# Patient Record
Sex: Female | Born: 1935
Health system: Southern US, Community
[De-identification: ages and names within clinical notes are randomized; demographics above are authoritative.]

## PROBLEM LIST (undated history)

## (undated) DIAGNOSIS — E785 Hyperlipidemia, unspecified: Secondary | ICD-10-CM

## (undated) DIAGNOSIS — I10 Essential (primary) hypertension: Secondary | ICD-10-CM

## (undated) DIAGNOSIS — F419 Anxiety disorder, unspecified: Secondary | ICD-10-CM

## (undated) HISTORY — DX: Anxiety disorder, unspecified: F41.9

## (undated) HISTORY — DX: Hyperlipidemia, unspecified: E78.5

## (undated) HISTORY — PX: APPENDECTOMY: SHX54

## (undated) HISTORY — PX: ABDOMINAL HYSTERECTOMY: SHX81

## (undated) HISTORY — PX: VEIN LIGATION AND STRIPPING: SHX2653

## (undated) HISTORY — PX: BLADDER SURGERY: SHX569

## (undated) HISTORY — PX: CATARACT EXTRACTION: SUR2

---

## 2001-04-16 ENCOUNTER — Ambulatory Visit (HOSPITAL_COMMUNITY): Admission: RE | Admit: 2001-04-16 | Discharge: 2001-04-16 | Payer: Self-pay

## 2010-06-10 ENCOUNTER — Emergency Department: Payer: Self-pay | Admitting: Emergency Medicine

## 2010-06-28 ENCOUNTER — Emergency Department (HOSPITAL_COMMUNITY): Admission: EM | Admit: 2010-06-28 | Discharge: 2010-06-29 | Payer: Self-pay | Admitting: Emergency Medicine

## 2011-01-03 LAB — DIFFERENTIAL
Basophils Absolute: 0 10*3/uL (ref 0.0–0.1)
Basophils Relative: 0 % (ref 0–1)
Eosinophils Absolute: 0 10*3/uL (ref 0.0–0.7)
Eosinophils Relative: 0 % (ref 0–5)
Lymphocytes Relative: 21 % (ref 12–46)
Lymphs Abs: 2.1 10*3/uL (ref 0.7–4.0)
Monocytes Absolute: 0.5 10*3/uL (ref 0.1–1.0)
Monocytes Relative: 5 % (ref 3–12)
Neutro Abs: 7.2 10*3/uL (ref 1.7–7.7)
Neutrophils Relative %: 73 % (ref 43–77)

## 2011-01-03 LAB — CBC
HCT: 41.1 % (ref 36.0–46.0)
Hemoglobin: 13.6 g/dL (ref 12.0–15.0)
MCH: 30.2 pg (ref 26.0–34.0)
MCHC: 33.2 g/dL (ref 30.0–36.0)
MCV: 90.9 fL (ref 78.0–100.0)
Platelets: 178 10*3/uL (ref 150–400)
RBC: 4.52 MIL/uL (ref 3.87–5.11)
RDW: 14.1 % (ref 11.5–15.5)
WBC: 9.9 10*3/uL (ref 4.0–10.5)

## 2011-01-03 LAB — BASIC METABOLIC PANEL
BUN: 17 mg/dL (ref 6–23)
CO2: 33 mEq/L — ABNORMAL HIGH (ref 19–32)
Calcium: 9.2 mg/dL (ref 8.4–10.5)
Chloride: 98 mEq/L (ref 96–112)
Creatinine, Ser: 1.26 mg/dL — ABNORMAL HIGH (ref 0.4–1.2)
GFR calc Af Amer: 50 mL/min — ABNORMAL LOW (ref >60)
GFR calc non Af Amer: 42 mL/min — ABNORMAL LOW (ref >60)
Glucose, Bld: 167 mg/dL — ABNORMAL HIGH (ref 70–99)
Potassium: 2.9 mEq/L — ABNORMAL LOW (ref 3.5–5.1)
Sodium: 140 mEq/L (ref 135–145)

## 2011-07-20 ENCOUNTER — Emergency Department (HOSPITAL_COMMUNITY)
Admission: EM | Admit: 2011-07-20 | Discharge: 2011-07-20 | Disposition: A | Discharge status: Home / Self Care | Payer: Medicare Other | Attending: Emergency Medicine | Admitting: Emergency Medicine

## 2011-07-20 ENCOUNTER — Encounter: Payer: Self-pay | Admitting: *Deleted

## 2011-07-20 DIAGNOSIS — R21 Rash and other nonspecific skin eruption: Secondary | ICD-10-CM | POA: Insufficient documentation

## 2011-07-20 DIAGNOSIS — R1110 Vomiting, unspecified: Secondary | ICD-10-CM | POA: Insufficient documentation

## 2011-07-20 DIAGNOSIS — T7840XA Allergy, unspecified, initial encounter: Secondary | ICD-10-CM

## 2011-07-20 DIAGNOSIS — L500 Allergic urticaria: Secondary | ICD-10-CM | POA: Insufficient documentation

## 2011-07-20 DIAGNOSIS — E876 Hypokalemia: Secondary | ICD-10-CM | POA: Insufficient documentation

## 2011-07-20 HISTORY — DX: Essential (primary) hypertension: I10

## 2011-07-20 LAB — DIFFERENTIAL
Basophils Relative: 0 % (ref 0–1)
Eosinophils Absolute: 0 10*3/uL (ref 0.0–0.7)
Eosinophils Relative: 0 % (ref 0–5)
Lymphs Abs: 1.4 10*3/uL (ref 0.7–4.0)
Monocytes Absolute: 0.4 10*3/uL (ref 0.1–1.0)
Monocytes Relative: 4 % (ref 3–12)
Neutrophils Relative %: 79 % — ABNORMAL HIGH (ref 43–77)

## 2011-07-20 LAB — BASIC METABOLIC PANEL
BUN: 25 mg/dL — ABNORMAL HIGH (ref 6–23)
CO2: 32 mEq/L (ref 19–32)
Chloride: 99 mEq/L (ref 96–112)
Glucose, Bld: 167 mg/dL — ABNORMAL HIGH (ref 70–99)
Potassium: 3 mEq/L — ABNORMAL LOW (ref 3.5–5.1)
Sodium: 140 mEq/L (ref 135–145)

## 2011-07-20 LAB — CBC
HCT: 42.7 % (ref 36.0–46.0)
Hemoglobin: 13.9 g/dL (ref 12.0–15.0)
MCH: 30 pg (ref 26.0–34.0)
MCHC: 32.6 g/dL (ref 30.0–36.0)
MCV: 92 fL (ref 78.0–100.0)
RBC: 4.64 MIL/uL (ref 3.87–5.11)

## 2011-07-20 MED ORDER — DIPHENHYDRAMINE HCL 50 MG/ML IJ SOLN
INTRAMUSCULAR | Status: AC
  Filled 2011-07-20: qty 1

## 2011-07-20 MED ORDER — METHYLPREDNISOLONE SODIUM SUCC 125 MG IJ SOLR
125.0000 mg | Freq: Once | INTRAMUSCULAR | Status: AC
  Administered 2011-07-20: 125 mg via INTRAVENOUS

## 2011-07-20 MED ORDER — DIPHENHYDRAMINE HCL 50 MG/ML IJ SOLN
50.0000 mg | Freq: Once | INTRAMUSCULAR | Status: AC
  Administered 2011-07-20: 50 mg via INTRAVENOUS

## 2011-07-20 MED ORDER — SODIUM CHLORIDE 0.9 % IV BOLUS (SEPSIS)
1000.0000 mL | Freq: Once | INTRAVENOUS | Status: AC
  Administered 2011-07-20: 1000 mL via INTRAVENOUS

## 2011-07-20 MED ORDER — METHYLPREDNISOLONE SODIUM SUCC 125 MG IJ SOLR
INTRAMUSCULAR | Status: AC
  Filled 2011-07-20: qty 2

## 2011-07-20 NOTE — ED Notes (Signed)
Pt c/o vomiting, hives, and tongue swelling since 2:30 this afternoon. Pt states that she has not taken or used any new medicines or products.

## 2011-07-20 NOTE — ED Provider Notes (Addendum)
I saw and examined this patient with the resident and agree with the chart and findings here in.  FaAlert HEENT:  Atraumatic Lungs:  No difficulty breathing Abdomen:  nondistended   Nelia Shi, MD 07/20/11 2051  Nelia Shi, MD 08/13/11 2241

## 2011-07-20 NOTE — ED Provider Notes (Signed)
History     CSN: [id number] Arrival date & time: 07/20/2011  6:08 PM  Chief Complaint  Patient presents with  . Allergic Reaction    (Consider location/radiation/quality/duration/timing/severity/associated sxs/prior treatment) HPI Comments: Pt was in normal state of health until about 2:30/3p this afternoon when she returned home from a trip and started feeling itchy around her underwear and bra lines. She noticed redness and welts so she took a benadryl and took a hot shower.  She rubbed liquid benadryl all over her body when she noticed the itching/redness spreading.  Also feels like her tongue is swollen. No swelling in throat, no difficulty swallowing or SOB. No fever/chills. No nausea but did vomit x1 when she got here. No known allergies.  [place], when they arrived there yest, noticed that a skunk had died so they sprayed the house w/ lysol and other products and put out vinegar water to help with the smell.  They went out to dinner, came back and slept there last night. She has had an allergic rxn like this 2x before, both times ~6 months ago (those 2 episodes were w/in a few weeks of eachother). Had allergy testing done at that time which was unremarkable and did not show any new allergies.  Is not taking any new medicines and has not eaten any new foods. She does not think she was stung or bitten by anything and was not in contact with any plants.   Patient is a 77 y.o. female presenting with allergic reaction. The history is provided by the patient. No language interpreter was used.  Allergic Reaction The primary symptoms are  vomiting, rash and urticaria. The primary symptoms do not include wheezing, shortness of breath, cough, abdominal pain, nausea, diarrhea, dizziness, palpitations, altered mental status or angioedema. The current episode started 3 to 5 hours ago. The problem has not changed since onset. The vomiting began  today. Vomiting occurred once. The emesis contains stomach contents.  The rash began today. The rash appears on the face, back, chest, torso, abdomen, left arm, left hand, left leg, left foot, right arm, right hand, right leg and right foot. The rash is associated with itching. The rash is not associated with blisters or weeping.  The urticaria began 3 to 5 hours ago. The urticaria has been unchanged since its onset. Urticaria is located on the abdomen.  Significant symptoms also include flushing and itching. Significant symptoms that are not present include eye redness or rhinorrhea.    Past Medical History  Diagnosis Date  . Hypertension     Past Surgical History  Procedure Date  . Abdominal hysterectomy   . Vein ligation and stripping     bilateral legs  . Bladder surgery     History reviewed. No pertinent family history.  History  Substance Use Topics  . Smoking status: Never Smoker   . Smokeless tobacco: Not on file  . Alcohol Use: No    OB History    Grav Para Term Preterm Abortions TAB SAB Ect Mult Living                  Review of Systems  Constitutional: Negative for fever, chills and diaphoresis.  HENT: Negative for hearing loss, congestion, sore throat, facial swelling, rhinorrhea, sneezing, trouble swallowing, neck pain, neck stiffness, voice change and postnasal drip.   Eyes: Negative for redness and itching.  Respiratory: Negative for cough, choking, chest tightness, shortness of  breath and wheezing.   Cardiovascular: Negative for chest pain, palpitations and leg swelling.  Gastrointestinal: Positive for vomiting. Negative for nausea, abdominal pain, diarrhea and constipation.  Genitourinary: Negative for dysuria, flank pain and difficulty urinating.  Musculoskeletal: Negative for back pain and joint swelling.  Skin: Positive for flushing, itching and rash. Negative for pallor.  Neurological: Negative for dizziness, facial asymmetry, weakness,  light-headedness, numbness and headaches.  Hematological: Negative for adenopathy.  Psychiatric/Behavioral: Negative for altered mental status.    Allergies  Doxycycline and Tetanus toxoids  Home Medications  No current outpatient prescriptions on file.  BP 120/80  Pulse 137  Temp(Src) 98.3 F (36.8 C) (Oral)  Resp 20  SpO2 96%  Physical Exam  Constitutional: She appears well-developed and well-nourished. No distress.  HENT:  Head: Normocephalic and atraumatic.  Mouth/Throat: No oropharyngeal exudate.  Eyes: Conjunctivae and EOM are normal. Pupils are equal, round, and reactive to light. Right eye exhibits no discharge. Left eye exhibits no discharge.  Neck: Normal range of motion. Neck supple.  Cardiovascular: Normal rate, regular rhythm, normal heart sounds and intact distal pulses.   No murmur heard. Pulmonary/Chest: Effort normal and breath sounds normal. No respiratory distress. She has no wheezes.  Abdominal: Soft. Bowel sounds are normal. She exhibits no distension. There is no tenderness.  Musculoskeletal: Normal range of motion. She exhibits no edema and no tenderness.  Lymphadenopathy:    She has no cervical adenopathy.  Skin: Skin is warm, dry and intact. Rash noted. No abrasion, no bruising and no petechiae noted. Rash is urticarial. Rash is not pustular and not vesicular. She is not diaphoretic. There is erythema. No pallor.       Urticaria over lower abdomen, otherwise, all lesions are erythematous and flat. Lesions are diffusely across body and involve forehead, eye lids, bilateral arms and legs, back and abdomen. No swelling.     ED Course  Procedures (including critical care time)  Labs Reviewed - No data to display No results found.   No diagnosis found.    MDM  Based on symptoms and exam, likely to be an IgE mediated allergic reaction.  Causative agent is not known.  Pt has had symptoms like this previously with a negative ED w/u and negative allergy  eval by allergist. No airway involvement or compromise. Pt reports tongue being swollen but on exam does not appear enlarged. Will give IV solumedrol and benadryl and monitor pt. She will like be able to be d/c'ed home. Trigger for this will likely not be discovered. Could be idiopathic in nature given this has happened previously and no trigger has been found.     Pt continues to be stable. Itching significantly improved s/p benadryl and solumedrol. Now just a little sleepy.  Feels like there is something stuck in her throat from when she vomited earlier. No SOB or throat tightness.  I gave pt 3 small spis of ginger ale and this relieved the sensation.  Likely not related to allergic reaction.  PCP/allergist could also consider w/u for bullous pemphigoid if they feel it is appropriate.  Lesions are not particularly blistering, so I do not feel a w/u in needed in the ED.    Labs relatively unremarkable. Encouraged pt to d/w PCP about hypokalemia found on labs past 2 ED visits.   Harvel Quale Reedy Biernat Resident 07/20/11 2013

## 2011-08-16 ENCOUNTER — Ambulatory Visit: Payer: Self-pay | Admitting: Internal Medicine

## 2011-08-21 ENCOUNTER — Ambulatory Visit: Payer: Self-pay | Admitting: Internal Medicine

## 2011-11-25 DIAGNOSIS — I209 Angina pectoris, unspecified: Secondary | ICD-10-CM | POA: Diagnosis not present

## 2011-11-25 DIAGNOSIS — I495 Sick sinus syndrome: Secondary | ICD-10-CM | POA: Diagnosis not present

## 2011-11-29 DIAGNOSIS — I080 Rheumatic disorders of both mitral and aortic valves: Secondary | ICD-10-CM | POA: Diagnosis not present

## 2011-11-29 DIAGNOSIS — I209 Angina pectoris, unspecified: Secondary | ICD-10-CM | POA: Diagnosis not present

## 2011-11-29 DIAGNOSIS — R079 Chest pain, unspecified: Secondary | ICD-10-CM | POA: Diagnosis not present

## 2011-12-04 DIAGNOSIS — I499 Cardiac arrhythmia, unspecified: Secondary | ICD-10-CM | POA: Diagnosis not present

## 2011-12-04 DIAGNOSIS — I209 Angina pectoris, unspecified: Secondary | ICD-10-CM | POA: Diagnosis not present

## 2011-12-04 DIAGNOSIS — R0602 Shortness of breath: Secondary | ICD-10-CM | POA: Diagnosis not present

## 2011-12-11 DIAGNOSIS — R42 Dizziness and giddiness: Secondary | ICD-10-CM | POA: Diagnosis not present

## 2011-12-11 DIAGNOSIS — T783XXA Angioneurotic edema, initial encounter: Secondary | ICD-10-CM | POA: Diagnosis not present

## 2011-12-17 DIAGNOSIS — R42 Dizziness and giddiness: Secondary | ICD-10-CM | POA: Diagnosis not present

## 2011-12-17 DIAGNOSIS — T783XXA Angioneurotic edema, initial encounter: Secondary | ICD-10-CM | POA: Diagnosis not present

## 2012-02-18 DIAGNOSIS — D649 Anemia, unspecified: Secondary | ICD-10-CM | POA: Diagnosis not present

## 2012-02-18 DIAGNOSIS — E785 Hyperlipidemia, unspecified: Secondary | ICD-10-CM | POA: Diagnosis not present

## 2012-02-18 DIAGNOSIS — E039 Hypothyroidism, unspecified: Secondary | ICD-10-CM | POA: Diagnosis not present

## 2012-02-18 DIAGNOSIS — E7800 Pure hypercholesterolemia, unspecified: Secondary | ICD-10-CM | POA: Diagnosis not present

## 2012-02-25 DIAGNOSIS — T783XXA Angioneurotic edema, initial encounter: Secondary | ICD-10-CM | POA: Diagnosis not present

## 2012-02-25 DIAGNOSIS — R42 Dizziness and giddiness: Secondary | ICD-10-CM | POA: Diagnosis not present

## 2012-02-25 DIAGNOSIS — E8881 Metabolic syndrome: Secondary | ICD-10-CM | POA: Diagnosis not present

## 2012-03-30 DIAGNOSIS — T783XXA Angioneurotic edema, initial encounter: Secondary | ICD-10-CM | POA: Diagnosis not present

## 2012-03-30 DIAGNOSIS — E8881 Metabolic syndrome: Secondary | ICD-10-CM | POA: Diagnosis not present

## 2012-03-30 DIAGNOSIS — R42 Dizziness and giddiness: Secondary | ICD-10-CM | POA: Diagnosis not present

## 2012-04-09 DIAGNOSIS — L253 Unspecified contact dermatitis due to other chemical products: Secondary | ICD-10-CM | POA: Diagnosis not present

## 2012-04-09 DIAGNOSIS — T783XXA Angioneurotic edema, initial encounter: Secondary | ICD-10-CM | POA: Diagnosis not present

## 2012-04-09 DIAGNOSIS — R42 Dizziness and giddiness: Secondary | ICD-10-CM | POA: Diagnosis not present

## 2012-04-09 DIAGNOSIS — E8881 Metabolic syndrome: Secondary | ICD-10-CM | POA: Diagnosis not present

## 2012-05-04 DIAGNOSIS — E8881 Metabolic syndrome: Secondary | ICD-10-CM | POA: Diagnosis not present

## 2012-05-04 DIAGNOSIS — T783XXA Angioneurotic edema, initial encounter: Secondary | ICD-10-CM | POA: Diagnosis not present

## 2012-05-04 DIAGNOSIS — E785 Hyperlipidemia, unspecified: Secondary | ICD-10-CM | POA: Diagnosis not present

## 2012-05-12 DIAGNOSIS — E8881 Metabolic syndrome: Secondary | ICD-10-CM | POA: Diagnosis not present

## 2012-05-12 DIAGNOSIS — T783XXA Angioneurotic edema, initial encounter: Secondary | ICD-10-CM | POA: Diagnosis not present

## 2012-07-16 DIAGNOSIS — T783XXA Angioneurotic edema, initial encounter: Secondary | ICD-10-CM | POA: Diagnosis not present

## 2012-07-16 DIAGNOSIS — L519 Erythema multiforme, unspecified: Secondary | ICD-10-CM | POA: Diagnosis not present

## 2012-07-29 ENCOUNTER — Emergency Department: Payer: Self-pay | Admitting: Emergency Medicine

## 2012-07-29 DIAGNOSIS — R21 Rash and other nonspecific skin eruption: Secondary | ICD-10-CM | POA: Diagnosis not present

## 2012-07-29 DIAGNOSIS — L500 Allergic urticaria: Secondary | ICD-10-CM | POA: Diagnosis not present

## 2012-07-29 DIAGNOSIS — T7840XA Allergy, unspecified, initial encounter: Secondary | ICD-10-CM | POA: Diagnosis not present

## 2012-07-29 DIAGNOSIS — R6889 Other general symptoms and signs: Secondary | ICD-10-CM | POA: Diagnosis not present

## 2012-07-29 DIAGNOSIS — I10 Essential (primary) hypertension: Secondary | ICD-10-CM | POA: Diagnosis not present

## 2012-07-29 DIAGNOSIS — Z9089 Acquired absence of other organs: Secondary | ICD-10-CM | POA: Diagnosis not present

## 2012-07-30 DIAGNOSIS — I209 Angina pectoris, unspecified: Secondary | ICD-10-CM | POA: Diagnosis not present

## 2012-07-30 DIAGNOSIS — T783XXA Angioneurotic edema, initial encounter: Secondary | ICD-10-CM | POA: Diagnosis not present

## 2012-08-11 DIAGNOSIS — J3081 Allergic rhinitis due to animal (cat) (dog) hair and dander: Secondary | ICD-10-CM | POA: Diagnosis not present

## 2012-08-11 DIAGNOSIS — T783XXA Angioneurotic edema, initial encounter: Secondary | ICD-10-CM | POA: Diagnosis not present

## 2012-08-11 DIAGNOSIS — J3089 Other allergic rhinitis: Secondary | ICD-10-CM | POA: Diagnosis not present

## 2012-08-20 DIAGNOSIS — H269 Unspecified cataract: Secondary | ICD-10-CM | POA: Diagnosis not present

## 2012-08-26 DIAGNOSIS — T783XXA Angioneurotic edema, initial encounter: Secondary | ICD-10-CM | POA: Diagnosis not present

## 2012-08-26 DIAGNOSIS — Z23 Encounter for immunization: Secondary | ICD-10-CM | POA: Diagnosis not present

## 2012-10-23 DIAGNOSIS — R079 Chest pain, unspecified: Secondary | ICD-10-CM | POA: Diagnosis not present

## 2012-11-19 DIAGNOSIS — R079 Chest pain, unspecified: Secondary | ICD-10-CM | POA: Diagnosis not present

## 2012-11-20 DIAGNOSIS — I499 Cardiac arrhythmia, unspecified: Secondary | ICD-10-CM | POA: Diagnosis not present

## 2012-12-02 DIAGNOSIS — Z888 Allergy status to other drugs, medicaments and biological substances status: Secondary | ICD-10-CM | POA: Diagnosis not present

## 2012-12-07 DIAGNOSIS — E8881 Metabolic syndrome: Secondary | ICD-10-CM | POA: Diagnosis not present

## 2012-12-07 DIAGNOSIS — I209 Angina pectoris, unspecified: Secondary | ICD-10-CM | POA: Diagnosis not present

## 2012-12-07 DIAGNOSIS — L509 Urticaria, unspecified: Secondary | ICD-10-CM | POA: Diagnosis not present

## 2012-12-07 DIAGNOSIS — R21 Rash and other nonspecific skin eruption: Secondary | ICD-10-CM | POA: Diagnosis not present

## 2013-01-14 DIAGNOSIS — T783XXA Angioneurotic edema, initial encounter: Secondary | ICD-10-CM | POA: Diagnosis not present

## 2013-01-14 DIAGNOSIS — L519 Erythema multiforme, unspecified: Secondary | ICD-10-CM | POA: Diagnosis not present

## 2013-01-14 DIAGNOSIS — Z888 Allergy status to other drugs, medicaments and biological substances status: Secondary | ICD-10-CM | POA: Diagnosis not present

## 2013-02-09 DIAGNOSIS — J3081 Allergic rhinitis due to animal (cat) (dog) hair and dander: Secondary | ICD-10-CM | POA: Diagnosis not present

## 2013-02-09 DIAGNOSIS — T783XXA Angioneurotic edema, initial encounter: Secondary | ICD-10-CM | POA: Diagnosis not present

## 2013-02-09 DIAGNOSIS — J3089 Other allergic rhinitis: Secondary | ICD-10-CM | POA: Diagnosis not present

## 2013-02-09 DIAGNOSIS — T781XXA Other adverse food reactions, not elsewhere classified, initial encounter: Secondary | ICD-10-CM | POA: Diagnosis not present

## 2013-03-17 DIAGNOSIS — I209 Angina pectoris, unspecified: Secondary | ICD-10-CM | POA: Diagnosis not present

## 2013-03-17 DIAGNOSIS — T783XXA Angioneurotic edema, initial encounter: Secondary | ICD-10-CM | POA: Diagnosis not present

## 2013-03-17 DIAGNOSIS — R42 Dizziness and giddiness: Secondary | ICD-10-CM | POA: Diagnosis not present

## 2013-03-17 DIAGNOSIS — I080 Rheumatic disorders of both mitral and aortic valves: Secondary | ICD-10-CM | POA: Diagnosis not present

## 2013-03-18 DIAGNOSIS — E785 Hyperlipidemia, unspecified: Secondary | ICD-10-CM | POA: Diagnosis not present

## 2013-03-18 DIAGNOSIS — E039 Hypothyroidism, unspecified: Secondary | ICD-10-CM | POA: Diagnosis not present

## 2013-03-18 DIAGNOSIS — T887XXA Unspecified adverse effect of drug or medicament, initial encounter: Secondary | ICD-10-CM | POA: Diagnosis not present

## 2013-03-25 DIAGNOSIS — N181 Chronic kidney disease, stage 1: Secondary | ICD-10-CM | POA: Diagnosis not present

## 2013-03-25 DIAGNOSIS — E785 Hyperlipidemia, unspecified: Secondary | ICD-10-CM | POA: Diagnosis not present

## 2013-03-25 DIAGNOSIS — I129 Hypertensive chronic kidney disease with stage 1 through stage 4 chronic kidney disease, or unspecified chronic kidney disease: Secondary | ICD-10-CM | POA: Diagnosis not present

## 2013-05-31 ENCOUNTER — Ambulatory Visit: Payer: Self-pay | Admitting: Internal Medicine

## 2013-05-31 DIAGNOSIS — R071 Chest pain on breathing: Secondary | ICD-10-CM | POA: Diagnosis not present

## 2013-05-31 DIAGNOSIS — R079 Chest pain, unspecified: Secondary | ICD-10-CM | POA: Diagnosis not present

## 2013-05-31 DIAGNOSIS — R0789 Other chest pain: Secondary | ICD-10-CM | POA: Diagnosis not present

## 2013-05-31 DIAGNOSIS — I517 Cardiomegaly: Secondary | ICD-10-CM | POA: Diagnosis not present

## 2013-05-31 DIAGNOSIS — Z789 Other specified health status: Secondary | ICD-10-CM | POA: Diagnosis not present

## 2013-05-31 DIAGNOSIS — Z950 Presence of cardiac pacemaker: Secondary | ICD-10-CM | POA: Diagnosis not present

## 2013-05-31 DIAGNOSIS — R918 Other nonspecific abnormal finding of lung field: Secondary | ICD-10-CM | POA: Diagnosis not present

## 2013-06-01 DIAGNOSIS — I495 Sick sinus syndrome: Secondary | ICD-10-CM | POA: Diagnosis not present

## 2013-06-01 DIAGNOSIS — I10 Essential (primary) hypertension: Secondary | ICD-10-CM | POA: Diagnosis not present

## 2013-06-01 DIAGNOSIS — I080 Rheumatic disorders of both mitral and aortic valves: Secondary | ICD-10-CM | POA: Diagnosis not present

## 2013-06-01 DIAGNOSIS — T783XXA Angioneurotic edema, initial encounter: Secondary | ICD-10-CM | POA: Diagnosis not present

## 2013-06-13 IMAGING — CR DG CHEST 2V
1 series · 2 of 2 positions shown · non-contrast
Comparison: none

REASON FOR EXAM: pacemaker insertion
COMMENTS:

PROCEDURE:     DXR - DXR CHEST PA (OR AP) AND LATERAL  - August 16, 2011 [DATE]
RESULT:     The lungs are clear. The heart and pulmonary vessels are normal.
The bony and mediastinal structures are unremarkable. There is no effusion.
There is no pneumothorax or evidence of congestive failure.

[Series 1: w chest pa · 0.14mm/px · 2 of 2 slices shown]
[im 1/2]
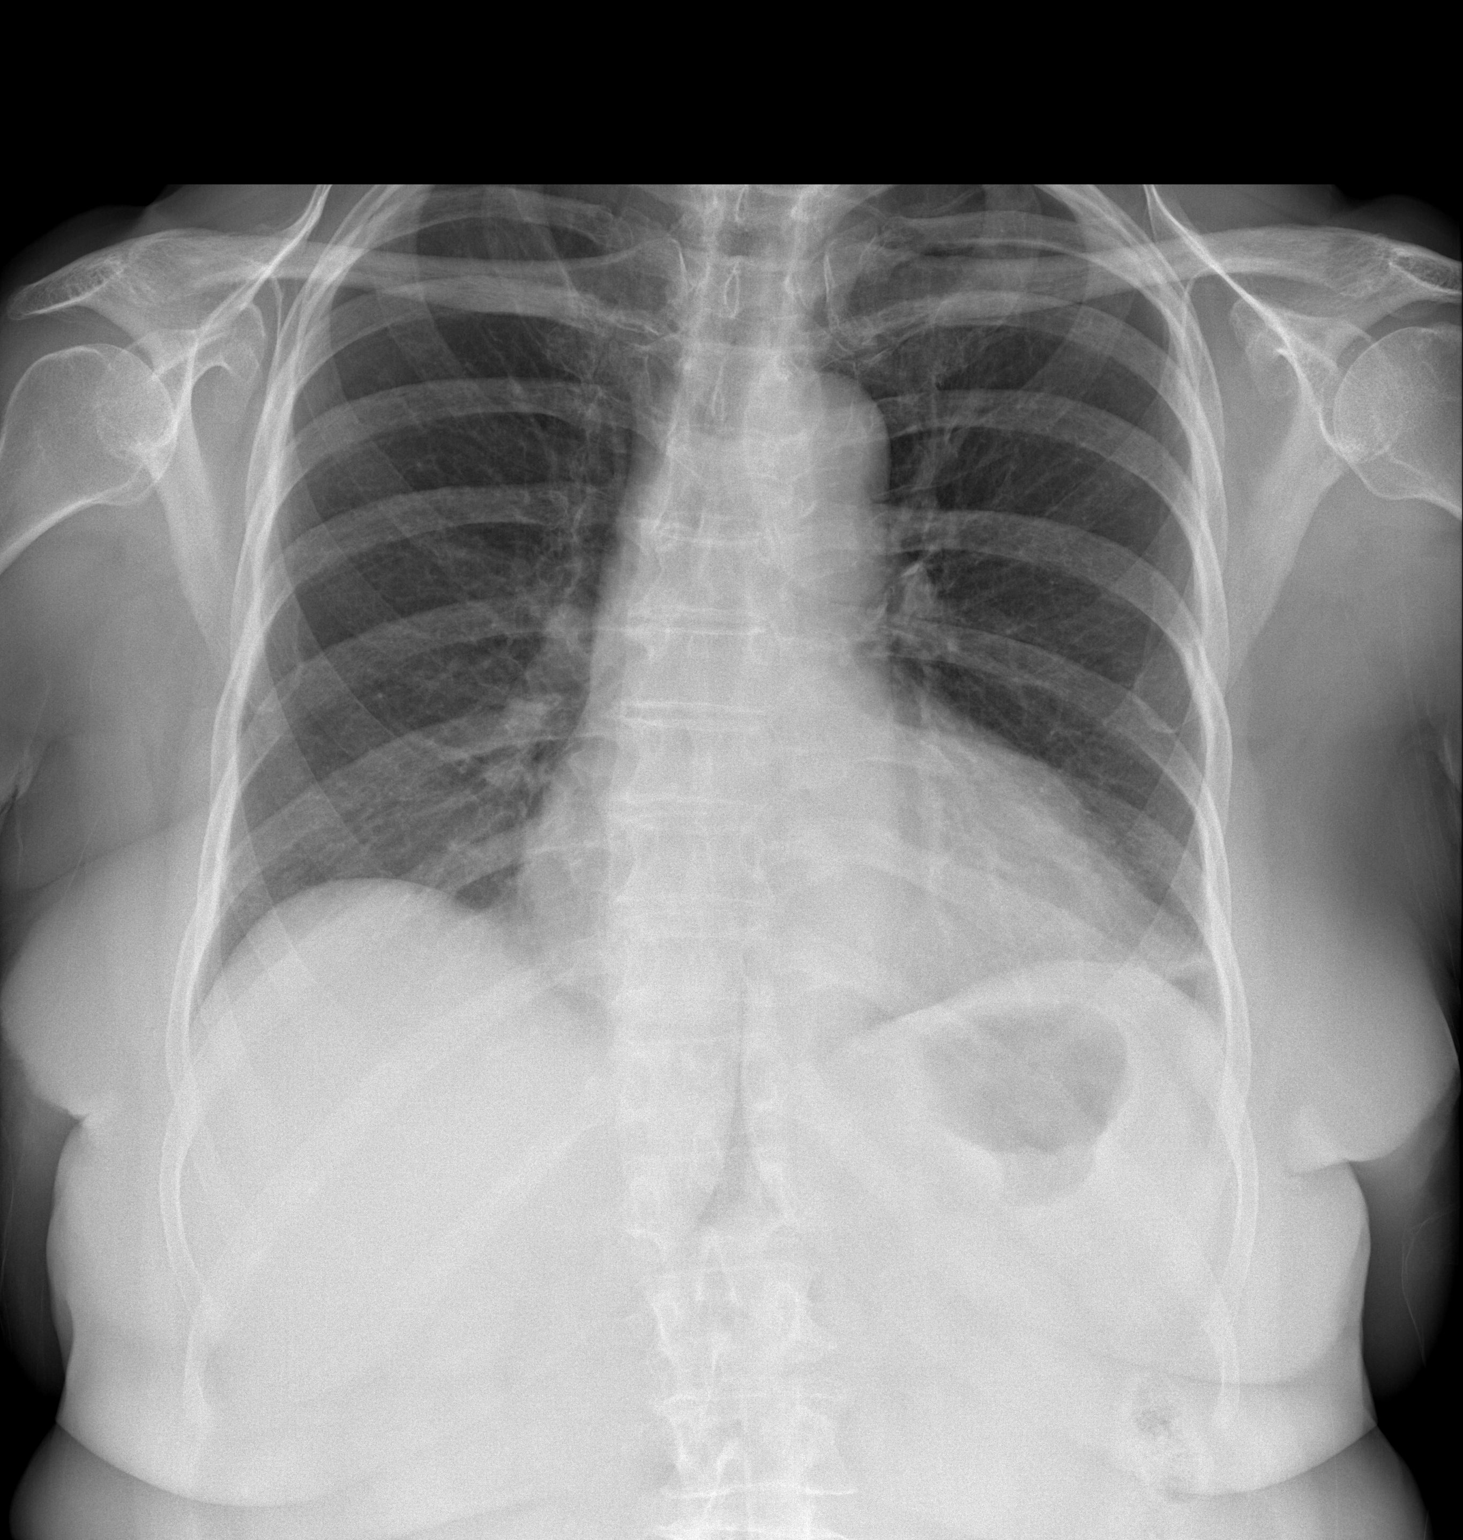
[im 2/2]
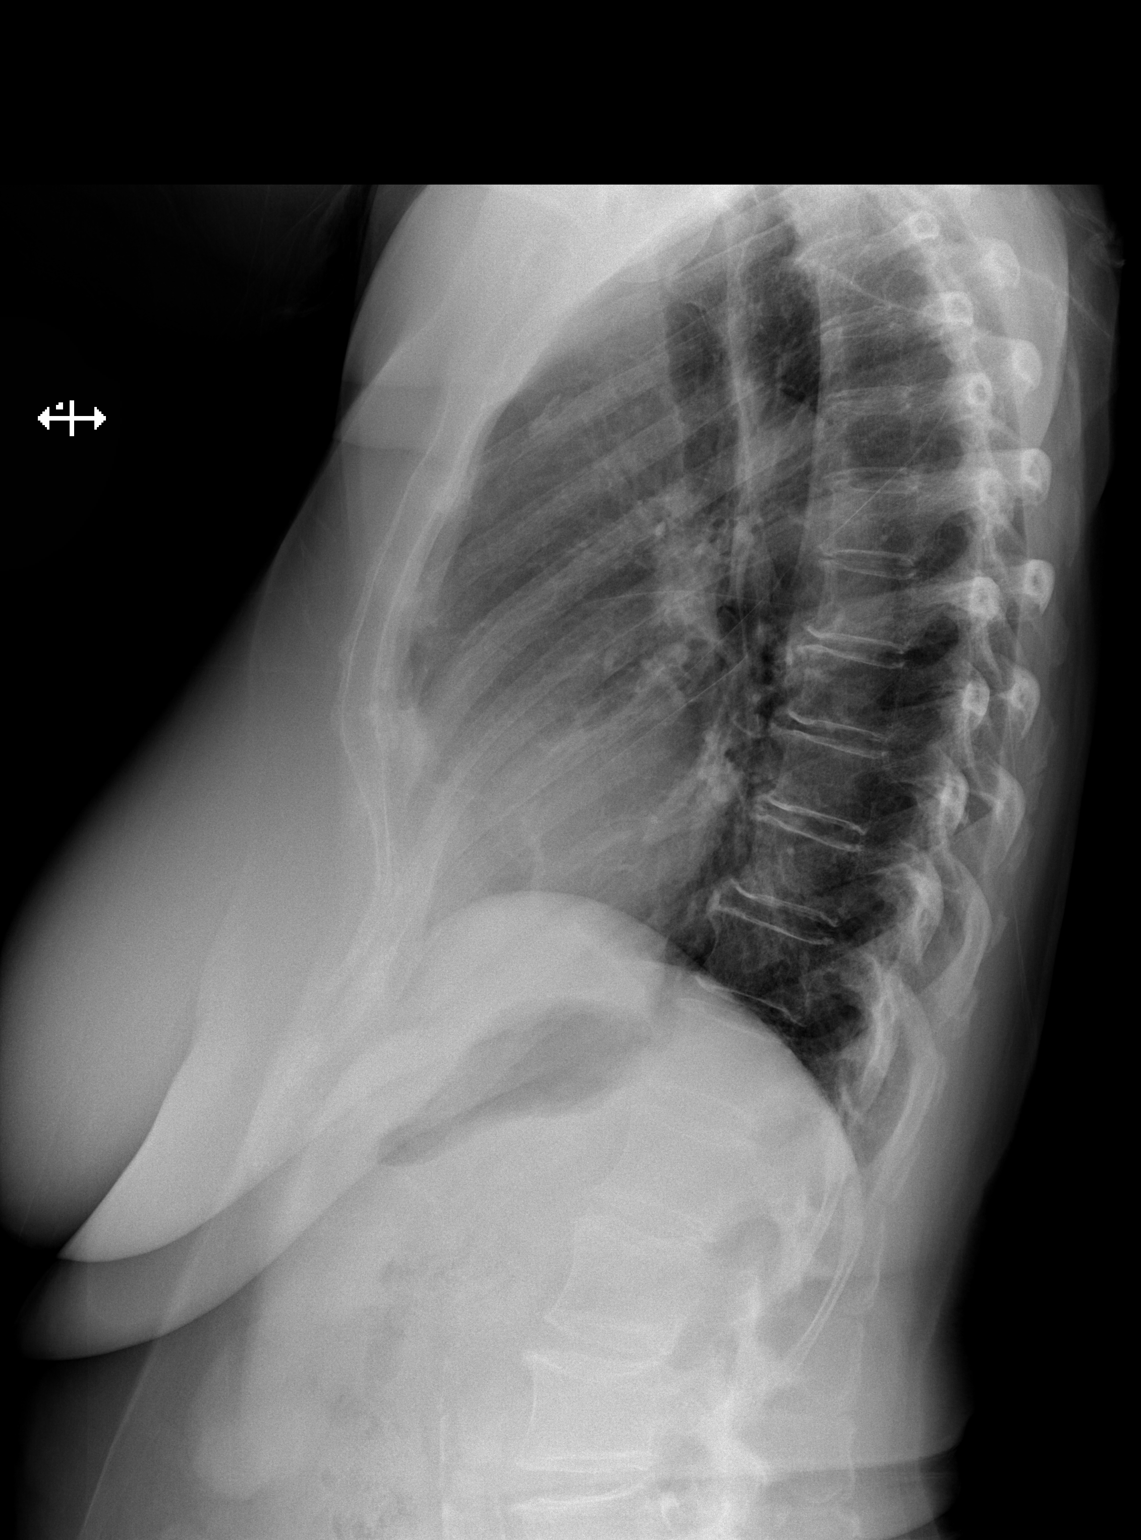

[2 of 2 positions shown; findings below may reference images not displayed]

IMPRESSION: No acute cardiopulmonary disease.

## 2013-09-13 DIAGNOSIS — I080 Rheumatic disorders of both mitral and aortic valves: Secondary | ICD-10-CM | POA: Diagnosis not present

## 2013-09-13 DIAGNOSIS — I10 Essential (primary) hypertension: Secondary | ICD-10-CM | POA: Diagnosis not present

## 2013-09-13 DIAGNOSIS — T783XXA Angioneurotic edema, initial encounter: Secondary | ICD-10-CM | POA: Diagnosis not present

## 2013-09-29 DIAGNOSIS — H2510 Age-related nuclear cataract, unspecified eye: Secondary | ICD-10-CM | POA: Diagnosis not present

## 2013-09-29 DIAGNOSIS — H5210 Myopia, unspecified eye: Secondary | ICD-10-CM | POA: Diagnosis not present

## 2013-11-15 DIAGNOSIS — M549 Dorsalgia, unspecified: Secondary | ICD-10-CM | POA: Diagnosis not present

## 2013-11-15 DIAGNOSIS — I209 Angina pectoris, unspecified: Secondary | ICD-10-CM | POA: Diagnosis not present

## 2013-11-15 DIAGNOSIS — T783XXA Angioneurotic edema, initial encounter: Secondary | ICD-10-CM | POA: Diagnosis not present

## 2013-11-15 DIAGNOSIS — I495 Sick sinus syndrome: Secondary | ICD-10-CM | POA: Diagnosis not present

## 2013-12-27 DIAGNOSIS — R5383 Other fatigue: Secondary | ICD-10-CM | POA: Diagnosis not present

## 2013-12-27 DIAGNOSIS — I10 Essential (primary) hypertension: Secondary | ICD-10-CM | POA: Diagnosis not present

## 2013-12-27 DIAGNOSIS — R5381 Other malaise: Secondary | ICD-10-CM | POA: Diagnosis not present

## 2014-01-04 DIAGNOSIS — H903 Sensorineural hearing loss, bilateral: Secondary | ICD-10-CM | POA: Diagnosis not present

## 2014-01-04 DIAGNOSIS — H6120 Impacted cerumen, unspecified ear: Secondary | ICD-10-CM | POA: Diagnosis not present

## 2014-01-17 DIAGNOSIS — I499 Cardiac arrhythmia, unspecified: Secondary | ICD-10-CM | POA: Diagnosis not present

## 2014-02-28 DIAGNOSIS — I10 Essential (primary) hypertension: Secondary | ICD-10-CM | POA: Diagnosis not present

## 2014-02-28 DIAGNOSIS — L2089 Other atopic dermatitis: Secondary | ICD-10-CM | POA: Diagnosis not present

## 2014-02-28 DIAGNOSIS — E8881 Metabolic syndrome: Secondary | ICD-10-CM | POA: Diagnosis not present

## 2014-02-28 DIAGNOSIS — I499 Cardiac arrhythmia, unspecified: Secondary | ICD-10-CM | POA: Diagnosis not present

## 2014-03-30 DIAGNOSIS — E039 Hypothyroidism, unspecified: Secondary | ICD-10-CM | POA: Diagnosis not present

## 2014-03-30 DIAGNOSIS — D649 Anemia, unspecified: Secondary | ICD-10-CM | POA: Diagnosis not present

## 2014-03-30 DIAGNOSIS — E785 Hyperlipidemia, unspecified: Secondary | ICD-10-CM | POA: Diagnosis not present

## 2014-05-31 DIAGNOSIS — I10 Essential (primary) hypertension: Secondary | ICD-10-CM | POA: Diagnosis not present

## 2014-05-31 DIAGNOSIS — I209 Angina pectoris, unspecified: Secondary | ICD-10-CM | POA: Diagnosis not present

## 2014-05-31 DIAGNOSIS — T783XXA Angioneurotic edema, initial encounter: Secondary | ICD-10-CM | POA: Diagnosis not present

## 2014-07-26 DIAGNOSIS — T781XXA Other adverse food reactions, not elsewhere classified, initial encounter: Secondary | ICD-10-CM | POA: Diagnosis not present

## 2014-07-26 DIAGNOSIS — J3081 Allergic rhinitis due to animal (cat) (dog) hair and dander: Secondary | ICD-10-CM | POA: Diagnosis not present

## 2014-07-26 DIAGNOSIS — J3089 Other allergic rhinitis: Secondary | ICD-10-CM | POA: Diagnosis not present

## 2014-08-30 DIAGNOSIS — T781XXA Other adverse food reactions, not elsewhere classified, initial encounter: Secondary | ICD-10-CM | POA: Diagnosis not present

## 2014-08-30 DIAGNOSIS — I10 Essential (primary) hypertension: Secondary | ICD-10-CM | POA: Diagnosis not present

## 2014-08-31 DIAGNOSIS — Z23 Encounter for immunization: Secondary | ICD-10-CM | POA: Diagnosis not present

## 2014-10-05 DIAGNOSIS — H26033 Infantile and juvenile nuclear cataract, bilateral: Secondary | ICD-10-CM | POA: Diagnosis not present

## 2014-10-05 DIAGNOSIS — H2513 Age-related nuclear cataract, bilateral: Secondary | ICD-10-CM | POA: Diagnosis not present

## 2014-10-05 DIAGNOSIS — H5213 Myopia, bilateral: Secondary | ICD-10-CM | POA: Diagnosis not present

## 2014-10-31 DIAGNOSIS — M719 Bursopathy, unspecified: Secondary | ICD-10-CM | POA: Diagnosis not present

## 2014-10-31 DIAGNOSIS — S43429A Sprain of unspecified rotator cuff capsule, initial encounter: Secondary | ICD-10-CM | POA: Diagnosis not present

## 2014-10-31 DIAGNOSIS — M6790 Unspecified disorder of synovium and tendon, unspecified site: Secondary | ICD-10-CM | POA: Diagnosis not present

## 2014-10-31 DIAGNOSIS — Z1389 Encounter for screening for other disorder: Secondary | ICD-10-CM | POA: Diagnosis not present

## 2014-12-01 DIAGNOSIS — E8881 Metabolic syndrome: Secondary | ICD-10-CM | POA: Diagnosis not present

## 2014-12-01 DIAGNOSIS — I209 Angina pectoris, unspecified: Secondary | ICD-10-CM | POA: Diagnosis not present

## 2014-12-01 DIAGNOSIS — R42 Dizziness and giddiness: Secondary | ICD-10-CM | POA: Diagnosis not present

## 2014-12-01 DIAGNOSIS — T783XXA Angioneurotic edema, initial encounter: Secondary | ICD-10-CM | POA: Diagnosis not present

## 2014-12-08 DIAGNOSIS — I209 Angina pectoris, unspecified: Secondary | ICD-10-CM | POA: Diagnosis not present

## 2014-12-08 DIAGNOSIS — E8881 Metabolic syndrome: Secondary | ICD-10-CM | POA: Diagnosis not present

## 2014-12-08 DIAGNOSIS — Z950 Presence of cardiac pacemaker: Secondary | ICD-10-CM | POA: Diagnosis not present

## 2014-12-08 DIAGNOSIS — Z888 Allergy status to other drugs, medicaments and biological substances status: Secondary | ICD-10-CM | POA: Diagnosis not present

## 2015-02-23 DIAGNOSIS — Z91013 Allergy to seafood: Secondary | ICD-10-CM | POA: Diagnosis not present

## 2015-02-23 DIAGNOSIS — I209 Angina pectoris, unspecified: Secondary | ICD-10-CM | POA: Diagnosis not present

## 2015-02-23 DIAGNOSIS — Z950 Presence of cardiac pacemaker: Secondary | ICD-10-CM | POA: Diagnosis not present

## 2015-02-23 DIAGNOSIS — I080 Rheumatic disorders of both mitral and aortic valves: Secondary | ICD-10-CM | POA: Diagnosis not present

## 2015-05-11 DIAGNOSIS — W57XXXA Bitten or stung by nonvenomous insect and other nonvenomous arthropods, initial encounter: Secondary | ICD-10-CM | POA: Diagnosis not present

## 2015-05-11 DIAGNOSIS — T63301A Toxic effect of unspecified spider venom, accidental (unintentional), initial encounter: Secondary | ICD-10-CM | POA: Diagnosis not present

## 2015-05-16 DIAGNOSIS — E784 Other hyperlipidemia: Secondary | ICD-10-CM | POA: Diagnosis not present

## 2015-05-16 DIAGNOSIS — R5381 Other malaise: Secondary | ICD-10-CM | POA: Diagnosis not present

## 2015-05-16 DIAGNOSIS — I10 Essential (primary) hypertension: Secondary | ICD-10-CM | POA: Diagnosis not present

## 2015-05-25 DIAGNOSIS — I10 Essential (primary) hypertension: Secondary | ICD-10-CM | POA: Diagnosis not present

## 2015-05-25 DIAGNOSIS — Z950 Presence of cardiac pacemaker: Secondary | ICD-10-CM | POA: Diagnosis not present

## 2015-05-25 DIAGNOSIS — E8881 Metabolic syndrome: Secondary | ICD-10-CM | POA: Diagnosis not present

## 2015-05-25 DIAGNOSIS — I080 Rheumatic disorders of both mitral and aortic valves: Secondary | ICD-10-CM | POA: Diagnosis not present

## 2015-05-25 DIAGNOSIS — I209 Angina pectoris, unspecified: Secondary | ICD-10-CM | POA: Diagnosis not present

## 2015-07-25 DIAGNOSIS — T781XXA Other adverse food reactions, not elsewhere classified, initial encounter: Secondary | ICD-10-CM | POA: Diagnosis not present

## 2015-07-25 DIAGNOSIS — J3081 Allergic rhinitis due to animal (cat) (dog) hair and dander: Secondary | ICD-10-CM | POA: Diagnosis not present

## 2015-07-25 DIAGNOSIS — J3089 Other allergic rhinitis: Secondary | ICD-10-CM | POA: Diagnosis not present

## 2015-08-16 DIAGNOSIS — Z23 Encounter for immunization: Secondary | ICD-10-CM | POA: Diagnosis not present

## 2015-08-24 DIAGNOSIS — Z950 Presence of cardiac pacemaker: Secondary | ICD-10-CM | POA: Diagnosis not present

## 2015-08-24 DIAGNOSIS — I10 Essential (primary) hypertension: Secondary | ICD-10-CM | POA: Diagnosis not present

## 2015-08-24 DIAGNOSIS — Z91013 Allergy to seafood: Secondary | ICD-10-CM | POA: Diagnosis not present

## 2015-08-24 DIAGNOSIS — E8881 Metabolic syndrome: Secondary | ICD-10-CM | POA: Diagnosis not present

## 2015-08-24 DIAGNOSIS — I209 Angina pectoris, unspecified: Secondary | ICD-10-CM | POA: Diagnosis not present

## 2015-09-29 DIAGNOSIS — H6123 Impacted cerumen, bilateral: Secondary | ICD-10-CM | POA: Diagnosis not present

## 2015-09-29 DIAGNOSIS — H8109 Meniere's disease, unspecified ear: Secondary | ICD-10-CM | POA: Diagnosis not present

## 2015-09-29 DIAGNOSIS — H903 Sensorineural hearing loss, bilateral: Secondary | ICD-10-CM | POA: Diagnosis not present

## 2015-11-24 DIAGNOSIS — Z950 Presence of cardiac pacemaker: Secondary | ICD-10-CM | POA: Diagnosis not present

## 2015-11-24 DIAGNOSIS — Z888 Allergy status to other drugs, medicaments and biological substances status: Secondary | ICD-10-CM | POA: Diagnosis not present

## 2015-11-24 DIAGNOSIS — I209 Angina pectoris, unspecified: Secondary | ICD-10-CM | POA: Diagnosis not present

## 2016-02-22 DIAGNOSIS — E8881 Metabolic syndrome: Secondary | ICD-10-CM | POA: Diagnosis not present

## 2016-02-22 DIAGNOSIS — I10 Essential (primary) hypertension: Secondary | ICD-10-CM | POA: Diagnosis not present

## 2016-02-22 DIAGNOSIS — Z91013 Allergy to seafood: Secondary | ICD-10-CM | POA: Diagnosis not present

## 2016-02-22 DIAGNOSIS — I498 Other specified cardiac arrhythmias: Secondary | ICD-10-CM | POA: Diagnosis not present

## 2016-02-22 DIAGNOSIS — Z950 Presence of cardiac pacemaker: Secondary | ICD-10-CM | POA: Diagnosis not present

## 2016-03-07 DIAGNOSIS — R42 Dizziness and giddiness: Secondary | ICD-10-CM | POA: Diagnosis not present

## 2016-03-07 DIAGNOSIS — N390 Urinary tract infection, site not specified: Secondary | ICD-10-CM | POA: Diagnosis not present

## 2016-03-07 DIAGNOSIS — I10 Essential (primary) hypertension: Secondary | ICD-10-CM | POA: Diagnosis not present

## 2016-03-07 DIAGNOSIS — Z91013 Allergy to seafood: Secondary | ICD-10-CM | POA: Diagnosis not present

## 2016-05-23 DIAGNOSIS — R42 Dizziness and giddiness: Secondary | ICD-10-CM | POA: Diagnosis not present

## 2016-05-23 DIAGNOSIS — I10 Essential (primary) hypertension: Secondary | ICD-10-CM | POA: Diagnosis not present

## 2016-05-23 DIAGNOSIS — E8881 Metabolic syndrome: Secondary | ICD-10-CM | POA: Diagnosis not present

## 2016-05-23 DIAGNOSIS — Z950 Presence of cardiac pacemaker: Secondary | ICD-10-CM | POA: Diagnosis not present

## 2016-05-23 DIAGNOSIS — I080 Rheumatic disorders of both mitral and aortic valves: Secondary | ICD-10-CM | POA: Diagnosis not present

## 2016-06-14 DIAGNOSIS — I10 Essential (primary) hypertension: Secondary | ICD-10-CM | POA: Diagnosis not present

## 2016-06-14 DIAGNOSIS — R5381 Other malaise: Secondary | ICD-10-CM | POA: Diagnosis not present

## 2016-06-14 DIAGNOSIS — E784 Other hyperlipidemia: Secondary | ICD-10-CM | POA: Diagnosis not present

## 2016-06-18 DIAGNOSIS — C4441 Basal cell carcinoma of skin of scalp and neck: Secondary | ICD-10-CM | POA: Diagnosis not present

## 2016-06-18 DIAGNOSIS — C4491 Basal cell carcinoma of skin, unspecified: Secondary | ICD-10-CM

## 2016-06-18 DIAGNOSIS — D485 Neoplasm of uncertain behavior of skin: Secondary | ICD-10-CM | POA: Diagnosis not present

## 2016-06-18 DIAGNOSIS — L720 Epidermal cyst: Secondary | ICD-10-CM | POA: Diagnosis not present

## 2016-06-18 HISTORY — DX: Basal cell carcinoma of skin, unspecified: C44.91

## 2016-06-20 DIAGNOSIS — Z0000 Encounter for general adult medical examination without abnormal findings: Secondary | ICD-10-CM | POA: Diagnosis not present

## 2016-06-20 DIAGNOSIS — Z23 Encounter for immunization: Secondary | ICD-10-CM | POA: Diagnosis not present

## 2016-06-21 ENCOUNTER — Other Ambulatory Visit: Payer: Self-pay

## 2016-07-23 DIAGNOSIS — J3089 Other allergic rhinitis: Secondary | ICD-10-CM | POA: Diagnosis not present

## 2016-07-23 DIAGNOSIS — J3081 Allergic rhinitis due to animal (cat) (dog) hair and dander: Secondary | ICD-10-CM | POA: Diagnosis not present

## 2016-07-23 DIAGNOSIS — Z91018 Allergy to other foods: Secondary | ICD-10-CM | POA: Diagnosis not present

## 2016-07-26 DIAGNOSIS — Z91018 Allergy to other foods: Secondary | ICD-10-CM | POA: Diagnosis not present

## 2016-08-15 DIAGNOSIS — I080 Rheumatic disorders of both mitral and aortic valves: Secondary | ICD-10-CM | POA: Diagnosis not present

## 2016-08-15 DIAGNOSIS — Z888 Allergy status to other drugs, medicaments and biological substances status: Secondary | ICD-10-CM | POA: Diagnosis not present

## 2016-08-15 DIAGNOSIS — T783XXA Angioneurotic edema, initial encounter: Secondary | ICD-10-CM | POA: Diagnosis not present

## 2016-08-15 DIAGNOSIS — I10 Essential (primary) hypertension: Secondary | ICD-10-CM | POA: Diagnosis not present

## 2016-08-22 DIAGNOSIS — E8881 Metabolic syndrome: Secondary | ICD-10-CM | POA: Diagnosis not present

## 2016-08-22 DIAGNOSIS — I080 Rheumatic disorders of both mitral and aortic valves: Secondary | ICD-10-CM | POA: Diagnosis not present

## 2016-08-22 DIAGNOSIS — T783XXA Angioneurotic edema, initial encounter: Secondary | ICD-10-CM | POA: Diagnosis not present

## 2016-08-22 DIAGNOSIS — I209 Angina pectoris, unspecified: Secondary | ICD-10-CM | POA: Diagnosis not present

## 2016-08-22 DIAGNOSIS — Z950 Presence of cardiac pacemaker: Secondary | ICD-10-CM | POA: Diagnosis not present

## 2016-10-23 DIAGNOSIS — H524 Presbyopia: Secondary | ICD-10-CM | POA: Diagnosis not present

## 2016-10-23 DIAGNOSIS — H2513 Age-related nuclear cataract, bilateral: Secondary | ICD-10-CM | POA: Diagnosis not present

## 2016-11-14 DIAGNOSIS — I080 Rheumatic disorders of both mitral and aortic valves: Secondary | ICD-10-CM | POA: Diagnosis not present

## 2016-11-14 DIAGNOSIS — E8881 Metabolic syndrome: Secondary | ICD-10-CM | POA: Diagnosis not present

## 2016-11-14 DIAGNOSIS — I209 Angina pectoris, unspecified: Secondary | ICD-10-CM | POA: Diagnosis not present

## 2016-11-14 DIAGNOSIS — Z950 Presence of cardiac pacemaker: Secondary | ICD-10-CM | POA: Diagnosis not present

## 2016-11-21 DIAGNOSIS — R079 Chest pain, unspecified: Secondary | ICD-10-CM | POA: Diagnosis not present

## 2016-11-21 DIAGNOSIS — I080 Rheumatic disorders of both mitral and aortic valves: Secondary | ICD-10-CM | POA: Diagnosis not present

## 2016-11-21 DIAGNOSIS — E8881 Metabolic syndrome: Secondary | ICD-10-CM | POA: Diagnosis not present

## 2016-11-21 DIAGNOSIS — I209 Angina pectoris, unspecified: Secondary | ICD-10-CM | POA: Diagnosis not present

## 2016-11-21 DIAGNOSIS — Z950 Presence of cardiac pacemaker: Secondary | ICD-10-CM | POA: Diagnosis not present

## 2016-11-25 DIAGNOSIS — I080 Rheumatic disorders of both mitral and aortic valves: Secondary | ICD-10-CM | POA: Diagnosis not present

## 2016-11-25 DIAGNOSIS — I209 Angina pectoris, unspecified: Secondary | ICD-10-CM | POA: Diagnosis not present

## 2016-11-25 DIAGNOSIS — I498 Other specified cardiac arrhythmias: Secondary | ICD-10-CM | POA: Diagnosis not present

## 2016-11-25 DIAGNOSIS — E8881 Metabolic syndrome: Secondary | ICD-10-CM | POA: Diagnosis not present

## 2016-11-25 DIAGNOSIS — R079 Chest pain, unspecified: Secondary | ICD-10-CM | POA: Diagnosis not present

## 2016-11-26 DIAGNOSIS — E8881 Metabolic syndrome: Secondary | ICD-10-CM | POA: Diagnosis not present

## 2016-11-26 DIAGNOSIS — Z950 Presence of cardiac pacemaker: Secondary | ICD-10-CM | POA: Diagnosis not present

## 2016-11-26 DIAGNOSIS — I209 Angina pectoris, unspecified: Secondary | ICD-10-CM | POA: Diagnosis not present

## 2016-11-26 DIAGNOSIS — R079 Chest pain, unspecified: Secondary | ICD-10-CM | POA: Diagnosis not present

## 2016-11-26 DIAGNOSIS — I080 Rheumatic disorders of both mitral and aortic valves: Secondary | ICD-10-CM | POA: Diagnosis not present

## 2017-01-09 DIAGNOSIS — Z85828 Personal history of other malignant neoplasm of skin: Secondary | ICD-10-CM | POA: Diagnosis not present

## 2017-01-09 DIAGNOSIS — L821 Other seborrheic keratosis: Secondary | ICD-10-CM | POA: Diagnosis not present

## 2017-01-09 DIAGNOSIS — L720 Epidermal cyst: Secondary | ICD-10-CM | POA: Diagnosis not present

## 2017-02-20 DIAGNOSIS — R5381 Other malaise: Secondary | ICD-10-CM | POA: Diagnosis not present

## 2017-02-20 DIAGNOSIS — Z888 Allergy status to other drugs, medicaments and biological substances status: Secondary | ICD-10-CM | POA: Diagnosis not present

## 2017-02-20 DIAGNOSIS — Z950 Presence of cardiac pacemaker: Secondary | ICD-10-CM | POA: Diagnosis not present

## 2017-02-20 DIAGNOSIS — I498 Other specified cardiac arrhythmias: Secondary | ICD-10-CM | POA: Diagnosis not present

## 2017-02-20 DIAGNOSIS — I495 Sick sinus syndrome: Secondary | ICD-10-CM | POA: Diagnosis not present

## 2017-05-22 DIAGNOSIS — I495 Sick sinus syndrome: Secondary | ICD-10-CM | POA: Diagnosis not present

## 2017-05-22 DIAGNOSIS — R42 Dizziness and giddiness: Secondary | ICD-10-CM | POA: Diagnosis not present

## 2017-05-22 DIAGNOSIS — Z950 Presence of cardiac pacemaker: Secondary | ICD-10-CM | POA: Diagnosis not present

## 2017-05-22 DIAGNOSIS — T783XXA Angioneurotic edema, initial encounter: Secondary | ICD-10-CM | POA: Diagnosis not present

## 2017-05-22 DIAGNOSIS — R5381 Other malaise: Secondary | ICD-10-CM | POA: Diagnosis not present

## 2017-06-12 DIAGNOSIS — R5381 Other malaise: Secondary | ICD-10-CM | POA: Diagnosis not present

## 2017-06-12 DIAGNOSIS — E784 Other hyperlipidemia: Secondary | ICD-10-CM | POA: Diagnosis not present

## 2017-06-12 DIAGNOSIS — I10 Essential (primary) hypertension: Secondary | ICD-10-CM | POA: Diagnosis not present

## 2017-06-16 DIAGNOSIS — R5381 Other malaise: Secondary | ICD-10-CM | POA: Diagnosis not present

## 2017-06-16 DIAGNOSIS — I495 Sick sinus syndrome: Secondary | ICD-10-CM | POA: Diagnosis not present

## 2017-06-16 DIAGNOSIS — R42 Dizziness and giddiness: Secondary | ICD-10-CM | POA: Diagnosis not present

## 2017-06-16 DIAGNOSIS — I209 Angina pectoris, unspecified: Secondary | ICD-10-CM | POA: Diagnosis not present

## 2017-07-22 DIAGNOSIS — J3081 Allergic rhinitis due to animal (cat) (dog) hair and dander: Secondary | ICD-10-CM | POA: Diagnosis not present

## 2017-07-22 DIAGNOSIS — Z91018 Allergy to other foods: Secondary | ICD-10-CM | POA: Diagnosis not present

## 2017-07-22 DIAGNOSIS — J3089 Other allergic rhinitis: Secondary | ICD-10-CM | POA: Diagnosis not present

## 2017-08-21 DIAGNOSIS — Z950 Presence of cardiac pacemaker: Secondary | ICD-10-CM | POA: Diagnosis not present

## 2017-08-21 DIAGNOSIS — R5381 Other malaise: Secondary | ICD-10-CM | POA: Diagnosis not present

## 2017-08-21 DIAGNOSIS — I209 Angina pectoris, unspecified: Secondary | ICD-10-CM | POA: Diagnosis not present

## 2017-08-21 DIAGNOSIS — R42 Dizziness and giddiness: Secondary | ICD-10-CM | POA: Diagnosis not present

## 2017-08-21 DIAGNOSIS — I495 Sick sinus syndrome: Secondary | ICD-10-CM | POA: Diagnosis not present

## 2017-09-16 DIAGNOSIS — I208 Other forms of angina pectoris: Secondary | ICD-10-CM | POA: Diagnosis not present

## 2017-09-16 DIAGNOSIS — I10 Essential (primary) hypertension: Secondary | ICD-10-CM | POA: Diagnosis not present

## 2017-09-16 DIAGNOSIS — Z888 Allergy status to other drugs, medicaments and biological substances status: Secondary | ICD-10-CM | POA: Diagnosis not present

## 2017-09-16 DIAGNOSIS — R5381 Other malaise: Secondary | ICD-10-CM | POA: Diagnosis not present

## 2017-09-25 DIAGNOSIS — R5381 Other malaise: Secondary | ICD-10-CM | POA: Diagnosis not present

## 2017-09-25 DIAGNOSIS — L253 Unspecified contact dermatitis due to other chemical products: Secondary | ICD-10-CM | POA: Diagnosis not present

## 2017-09-25 DIAGNOSIS — E8881 Metabolic syndrome: Secondary | ICD-10-CM | POA: Diagnosis not present

## 2017-09-25 DIAGNOSIS — R001 Bradycardia, unspecified: Secondary | ICD-10-CM | POA: Diagnosis not present

## 2017-09-25 DIAGNOSIS — I080 Rheumatic disorders of both mitral and aortic valves: Secondary | ICD-10-CM | POA: Diagnosis not present

## 2017-09-26 DIAGNOSIS — L253 Unspecified contact dermatitis due to other chemical products: Secondary | ICD-10-CM | POA: Diagnosis not present

## 2017-09-26 DIAGNOSIS — I080 Rheumatic disorders of both mitral and aortic valves: Secondary | ICD-10-CM | POA: Diagnosis not present

## 2017-09-26 DIAGNOSIS — R5381 Other malaise: Secondary | ICD-10-CM | POA: Diagnosis not present

## 2017-09-26 DIAGNOSIS — R001 Bradycardia, unspecified: Secondary | ICD-10-CM | POA: Diagnosis not present

## 2017-11-26 DIAGNOSIS — H2513 Age-related nuclear cataract, bilateral: Secondary | ICD-10-CM | POA: Diagnosis not present

## 2017-11-27 DIAGNOSIS — I209 Angina pectoris, unspecified: Secondary | ICD-10-CM | POA: Diagnosis not present

## 2017-11-27 DIAGNOSIS — R42 Dizziness and giddiness: Secondary | ICD-10-CM | POA: Diagnosis not present

## 2017-11-27 DIAGNOSIS — E8881 Metabolic syndrome: Secondary | ICD-10-CM | POA: Diagnosis not present

## 2017-11-27 DIAGNOSIS — Z950 Presence of cardiac pacemaker: Secondary | ICD-10-CM | POA: Diagnosis not present

## 2017-12-02 DIAGNOSIS — R5381 Other malaise: Secondary | ICD-10-CM | POA: Diagnosis not present

## 2017-12-02 DIAGNOSIS — D508 Other iron deficiency anemias: Secondary | ICD-10-CM | POA: Diagnosis not present

## 2017-12-05 DIAGNOSIS — R42 Dizziness and giddiness: Secondary | ICD-10-CM | POA: Diagnosis not present

## 2017-12-05 DIAGNOSIS — E8881 Metabolic syndrome: Secondary | ICD-10-CM | POA: Diagnosis not present

## 2017-12-05 DIAGNOSIS — R001 Bradycardia, unspecified: Secondary | ICD-10-CM | POA: Diagnosis not present

## 2017-12-05 DIAGNOSIS — I209 Angina pectoris, unspecified: Secondary | ICD-10-CM | POA: Diagnosis not present

## 2017-12-05 DIAGNOSIS — Z23 Encounter for immunization: Secondary | ICD-10-CM | POA: Diagnosis not present

## 2018-01-12 DIAGNOSIS — Z1283 Encounter for screening for malignant neoplasm of skin: Secondary | ICD-10-CM | POA: Diagnosis not present

## 2018-01-12 DIAGNOSIS — Z85828 Personal history of other malignant neoplasm of skin: Secondary | ICD-10-CM | POA: Diagnosis not present

## 2018-01-12 DIAGNOSIS — L814 Other melanin hyperpigmentation: Secondary | ICD-10-CM | POA: Diagnosis not present

## 2018-01-12 DIAGNOSIS — D692 Other nonthrombocytopenic purpura: Secondary | ICD-10-CM | POA: Diagnosis not present

## 2018-01-12 DIAGNOSIS — I8393 Asymptomatic varicose veins of bilateral lower extremities: Secondary | ICD-10-CM | POA: Diagnosis not present

## 2018-01-12 DIAGNOSIS — D1800 Hemangioma unspecified site: Secondary | ICD-10-CM | POA: Diagnosis not present

## 2018-01-12 DIAGNOSIS — L821 Other seborrheic keratosis: Secondary | ICD-10-CM | POA: Diagnosis not present

## 2018-01-12 DIAGNOSIS — L578 Other skin changes due to chronic exposure to nonionizing radiation: Secondary | ICD-10-CM | POA: Diagnosis not present

## 2018-01-12 DIAGNOSIS — L720 Epidermal cyst: Secondary | ICD-10-CM | POA: Diagnosis not present

## 2018-02-19 DIAGNOSIS — I10 Essential (primary) hypertension: Secondary | ICD-10-CM | POA: Diagnosis not present

## 2018-02-19 DIAGNOSIS — I209 Angina pectoris, unspecified: Secondary | ICD-10-CM | POA: Diagnosis not present

## 2018-02-19 DIAGNOSIS — Z950 Presence of cardiac pacemaker: Secondary | ICD-10-CM | POA: Diagnosis not present

## 2018-02-19 DIAGNOSIS — Z91013 Allergy to seafood: Secondary | ICD-10-CM | POA: Diagnosis not present

## 2018-02-19 DIAGNOSIS — E8881 Metabolic syndrome: Secondary | ICD-10-CM | POA: Diagnosis not present

## 2018-03-26 DIAGNOSIS — R531 Weakness: Secondary | ICD-10-CM | POA: Diagnosis not present

## 2018-03-26 DIAGNOSIS — R5383 Other fatigue: Secondary | ICD-10-CM | POA: Diagnosis not present

## 2018-03-26 DIAGNOSIS — R5381 Other malaise: Secondary | ICD-10-CM | POA: Diagnosis not present

## 2018-05-21 ENCOUNTER — Other Ambulatory Visit: Payer: Self-pay

## 2018-05-21 DIAGNOSIS — I209 Angina pectoris, unspecified: Secondary | ICD-10-CM | POA: Diagnosis not present

## 2018-05-21 DIAGNOSIS — Z950 Presence of cardiac pacemaker: Secondary | ICD-10-CM | POA: Diagnosis not present

## 2018-05-21 DIAGNOSIS — R42 Dizziness and giddiness: Secondary | ICD-10-CM | POA: Diagnosis not present

## 2018-05-21 DIAGNOSIS — E8881 Metabolic syndrome: Secondary | ICD-10-CM | POA: Diagnosis not present

## 2018-07-21 DIAGNOSIS — J3089 Other allergic rhinitis: Secondary | ICD-10-CM | POA: Diagnosis not present

## 2018-07-21 DIAGNOSIS — Z91018 Allergy to other foods: Secondary | ICD-10-CM | POA: Diagnosis not present

## 2018-07-21 DIAGNOSIS — J3081 Allergic rhinitis due to animal (cat) (dog) hair and dander: Secondary | ICD-10-CM | POA: Diagnosis not present

## 2018-08-27 DIAGNOSIS — I10 Essential (primary) hypertension: Secondary | ICD-10-CM | POA: Diagnosis not present

## 2018-08-27 DIAGNOSIS — E8881 Metabolic syndrome: Secondary | ICD-10-CM | POA: Diagnosis not present

## 2018-08-27 DIAGNOSIS — Z950 Presence of cardiac pacemaker: Secondary | ICD-10-CM | POA: Diagnosis not present

## 2018-08-27 DIAGNOSIS — I209 Angina pectoris, unspecified: Secondary | ICD-10-CM | POA: Diagnosis not present

## 2018-08-27 DIAGNOSIS — R42 Dizziness and giddiness: Secondary | ICD-10-CM | POA: Diagnosis not present

## 2018-09-10 ENCOUNTER — Other Ambulatory Visit: Payer: Self-pay

## 2018-10-22 DIAGNOSIS — I209 Angina pectoris, unspecified: Secondary | ICD-10-CM | POA: Diagnosis not present

## 2018-10-22 DIAGNOSIS — E8881 Metabolic syndrome: Secondary | ICD-10-CM | POA: Diagnosis not present

## 2018-10-22 DIAGNOSIS — I080 Rheumatic disorders of both mitral and aortic valves: Secondary | ICD-10-CM | POA: Diagnosis not present

## 2018-10-22 DIAGNOSIS — R42 Dizziness and giddiness: Secondary | ICD-10-CM | POA: Diagnosis not present

## 2018-11-05 DIAGNOSIS — I209 Angina pectoris, unspecified: Secondary | ICD-10-CM | POA: Diagnosis not present

## 2018-11-05 DIAGNOSIS — Z950 Presence of cardiac pacemaker: Secondary | ICD-10-CM | POA: Diagnosis not present

## 2018-11-05 DIAGNOSIS — R42 Dizziness and giddiness: Secondary | ICD-10-CM | POA: Diagnosis not present

## 2018-11-05 DIAGNOSIS — I080 Rheumatic disorders of both mitral and aortic valves: Secondary | ICD-10-CM | POA: Diagnosis not present

## 2018-11-26 DIAGNOSIS — I209 Angina pectoris, unspecified: Secondary | ICD-10-CM | POA: Diagnosis not present

## 2018-11-26 DIAGNOSIS — R42 Dizziness and giddiness: Secondary | ICD-10-CM | POA: Diagnosis not present

## 2018-11-26 DIAGNOSIS — T783XXA Angioneurotic edema, initial encounter: Secondary | ICD-10-CM | POA: Diagnosis not present

## 2018-11-26 DIAGNOSIS — Z950 Presence of cardiac pacemaker: Secondary | ICD-10-CM | POA: Diagnosis not present

## 2018-11-26 DIAGNOSIS — I10 Essential (primary) hypertension: Secondary | ICD-10-CM | POA: Diagnosis not present

## 2018-11-27 DIAGNOSIS — H2513 Age-related nuclear cataract, bilateral: Secondary | ICD-10-CM | POA: Diagnosis not present

## 2018-11-27 DIAGNOSIS — H524 Presbyopia: Secondary | ICD-10-CM | POA: Diagnosis not present

## 2019-02-25 DIAGNOSIS — I10 Essential (primary) hypertension: Secondary | ICD-10-CM | POA: Diagnosis not present

## 2019-02-25 DIAGNOSIS — I209 Angina pectoris, unspecified: Secondary | ICD-10-CM | POA: Diagnosis not present

## 2019-02-25 DIAGNOSIS — R42 Dizziness and giddiness: Secondary | ICD-10-CM | POA: Diagnosis not present

## 2019-02-25 DIAGNOSIS — Z950 Presence of cardiac pacemaker: Secondary | ICD-10-CM | POA: Diagnosis not present

## 2019-02-25 DIAGNOSIS — E8881 Metabolic syndrome: Secondary | ICD-10-CM | POA: Diagnosis not present

## 2019-03-25 DIAGNOSIS — T783XXA Angioneurotic edema, initial encounter: Secondary | ICD-10-CM | POA: Diagnosis not present

## 2019-03-25 DIAGNOSIS — I495 Sick sinus syndrome: Secondary | ICD-10-CM | POA: Diagnosis not present

## 2019-03-25 DIAGNOSIS — E8881 Metabolic syndrome: Secondary | ICD-10-CM | POA: Diagnosis not present

## 2019-03-25 DIAGNOSIS — R42 Dizziness and giddiness: Secondary | ICD-10-CM | POA: Diagnosis not present

## 2019-05-27 DIAGNOSIS — E8881 Metabolic syndrome: Secondary | ICD-10-CM | POA: Diagnosis not present

## 2019-05-27 DIAGNOSIS — I080 Rheumatic disorders of both mitral and aortic valves: Secondary | ICD-10-CM | POA: Diagnosis not present

## 2019-05-27 DIAGNOSIS — Z950 Presence of cardiac pacemaker: Secondary | ICD-10-CM | POA: Diagnosis not present

## 2019-05-27 DIAGNOSIS — T783XXA Angioneurotic edema, initial encounter: Secondary | ICD-10-CM | POA: Diagnosis not present

## 2019-05-27 DIAGNOSIS — Z888 Allergy status to other drugs, medicaments and biological substances status: Secondary | ICD-10-CM | POA: Diagnosis not present

## 2019-06-08 DIAGNOSIS — R5381 Other malaise: Secondary | ICD-10-CM | POA: Diagnosis not present

## 2019-06-08 DIAGNOSIS — I10 Essential (primary) hypertension: Secondary | ICD-10-CM | POA: Diagnosis not present

## 2019-06-08 DIAGNOSIS — R5383 Other fatigue: Secondary | ICD-10-CM | POA: Diagnosis not present

## 2019-06-08 DIAGNOSIS — D508 Other iron deficiency anemias: Secondary | ICD-10-CM | POA: Diagnosis not present

## 2019-06-08 DIAGNOSIS — Z23 Encounter for immunization: Secondary | ICD-10-CM | POA: Diagnosis not present

## 2019-06-08 DIAGNOSIS — R531 Weakness: Secondary | ICD-10-CM | POA: Diagnosis not present

## 2019-06-10 DIAGNOSIS — E8881 Metabolic syndrome: Secondary | ICD-10-CM | POA: Diagnosis not present

## 2019-06-10 DIAGNOSIS — I209 Angina pectoris, unspecified: Secondary | ICD-10-CM | POA: Diagnosis not present

## 2019-06-10 DIAGNOSIS — Z888 Allergy status to other drugs, medicaments and biological substances status: Secondary | ICD-10-CM | POA: Diagnosis not present

## 2019-06-10 DIAGNOSIS — I080 Rheumatic disorders of both mitral and aortic valves: Secondary | ICD-10-CM | POA: Diagnosis not present

## 2019-07-20 DIAGNOSIS — J3089 Other allergic rhinitis: Secondary | ICD-10-CM | POA: Diagnosis not present

## 2019-07-20 DIAGNOSIS — I209 Angina pectoris, unspecified: Secondary | ICD-10-CM | POA: Diagnosis not present

## 2019-07-20 DIAGNOSIS — Z91018 Allergy to other foods: Secondary | ICD-10-CM | POA: Diagnosis not present

## 2019-07-20 DIAGNOSIS — Z950 Presence of cardiac pacemaker: Secondary | ICD-10-CM | POA: Diagnosis not present

## 2019-07-20 DIAGNOSIS — I080 Rheumatic disorders of both mitral and aortic valves: Secondary | ICD-10-CM | POA: Diagnosis not present

## 2019-07-20 DIAGNOSIS — J3081 Allergic rhinitis due to animal (cat) (dog) hair and dander: Secondary | ICD-10-CM | POA: Diagnosis not present

## 2019-07-20 DIAGNOSIS — E8881 Metabolic syndrome: Secondary | ICD-10-CM | POA: Diagnosis not present

## 2019-08-26 DIAGNOSIS — R42 Dizziness and giddiness: Secondary | ICD-10-CM | POA: Diagnosis not present

## 2019-08-26 DIAGNOSIS — I080 Rheumatic disorders of both mitral and aortic valves: Secondary | ICD-10-CM | POA: Diagnosis not present

## 2019-08-26 DIAGNOSIS — T783XXA Angioneurotic edema, initial encounter: Secondary | ICD-10-CM | POA: Diagnosis not present

## 2019-08-26 DIAGNOSIS — Z950 Presence of cardiac pacemaker: Secondary | ICD-10-CM | POA: Diagnosis not present

## 2019-09-22 DIAGNOSIS — I080 Rheumatic disorders of both mitral and aortic valves: Secondary | ICD-10-CM | POA: Diagnosis not present

## 2019-09-22 DIAGNOSIS — I10 Essential (primary) hypertension: Secondary | ICD-10-CM | POA: Diagnosis not present

## 2019-09-22 DIAGNOSIS — T783XXA Angioneurotic edema, initial encounter: Secondary | ICD-10-CM | POA: Diagnosis not present

## 2019-09-22 DIAGNOSIS — R42 Dizziness and giddiness: Secondary | ICD-10-CM | POA: Diagnosis not present

## 2019-11-25 DIAGNOSIS — Z950 Presence of cardiac pacemaker: Secondary | ICD-10-CM | POA: Diagnosis not present

## 2019-11-25 DIAGNOSIS — T783XXA Angioneurotic edema, initial encounter: Secondary | ICD-10-CM | POA: Diagnosis not present

## 2019-11-25 DIAGNOSIS — I10 Essential (primary) hypertension: Secondary | ICD-10-CM | POA: Diagnosis not present

## 2019-11-25 DIAGNOSIS — I080 Rheumatic disorders of both mitral and aortic valves: Secondary | ICD-10-CM | POA: Diagnosis not present

## 2019-11-25 DIAGNOSIS — E8881 Metabolic syndrome: Secondary | ICD-10-CM | POA: Diagnosis not present

## 2019-11-29 DIAGNOSIS — T783XXA Angioneurotic edema, initial encounter: Secondary | ICD-10-CM | POA: Diagnosis not present

## 2019-11-29 DIAGNOSIS — R42 Dizziness and giddiness: Secondary | ICD-10-CM | POA: Diagnosis not present

## 2019-11-29 DIAGNOSIS — I080 Rheumatic disorders of both mitral and aortic valves: Secondary | ICD-10-CM | POA: Diagnosis not present

## 2019-11-29 DIAGNOSIS — E8881 Metabolic syndrome: Secondary | ICD-10-CM | POA: Diagnosis not present

## 2019-11-29 DIAGNOSIS — I209 Angina pectoris, unspecified: Secondary | ICD-10-CM | POA: Diagnosis not present

## 2019-12-01 DIAGNOSIS — Z23 Encounter for immunization: Secondary | ICD-10-CM | POA: Diagnosis not present

## 2019-12-21 DIAGNOSIS — R079 Chest pain, unspecified: Secondary | ICD-10-CM | POA: Diagnosis not present

## 2019-12-21 DIAGNOSIS — I080 Rheumatic disorders of both mitral and aortic valves: Secondary | ICD-10-CM | POA: Diagnosis not present

## 2019-12-22 ENCOUNTER — Other Ambulatory Visit: Payer: Self-pay

## 2019-12-22 ENCOUNTER — Other Ambulatory Visit: Payer: Self-pay | Admitting: Internal Medicine

## 2019-12-22 ENCOUNTER — Ambulatory Visit
Admission: RE | Admit: 2019-12-22 | Discharge: 2019-12-22 | Disposition: A | Discharge status: Home / Self Care | Payer: Medicare Other | Attending: Internal Medicine | Admitting: Internal Medicine

## 2019-12-22 ENCOUNTER — Ambulatory Visit
Admission: RE | Admit: 2019-12-22 | Discharge: 2019-12-22 | Disposition: A | Discharge status: Home / Self Care | Payer: Medicare Other | Source: Ambulatory Visit | Attending: Internal Medicine | Admitting: Internal Medicine

## 2019-12-22 DIAGNOSIS — R079 Chest pain, unspecified: Secondary | ICD-10-CM | POA: Insufficient documentation

## 2019-12-30 DIAGNOSIS — H524 Presbyopia: Secondary | ICD-10-CM | POA: Diagnosis not present

## 2019-12-30 DIAGNOSIS — H2513 Age-related nuclear cataract, bilateral: Secondary | ICD-10-CM | POA: Diagnosis not present

## 2020-02-24 ENCOUNTER — Other Ambulatory Visit: Payer: Self-pay

## 2020-02-24 ENCOUNTER — Ambulatory Visit (INDEPENDENT_AMBULATORY_CARE_PROVIDER_SITE_OTHER): Payer: Medicare Other | Admitting: Internal Medicine

## 2020-02-24 DIAGNOSIS — I480 Paroxysmal atrial fibrillation: Secondary | ICD-10-CM

## 2020-02-24 DIAGNOSIS — Z950 Presence of cardiac pacemaker: Secondary | ICD-10-CM | POA: Insufficient documentation

## 2020-02-24 DIAGNOSIS — Z87891 Personal history of nicotine dependence: Secondary | ICD-10-CM | POA: Diagnosis not present

## 2020-02-24 NOTE — Progress Notes (Signed)
Established Patient Office Visit  Subjective:  Patient ID: Jessica Browning, female    DOB: 03-Jul-1934  Age: 86 y.o. MRN: [id number]  CC:  Chief Complaint  Patient presents with  . Pacemaker Check    HPI  Jessica Browning presents for pacemaker check.  She has a pacemaker for sick sinus syndrome she is also known to have hypertension . an occasional palpitation. she denies any history of chest pain nausea vomiting. there is no pain at the pacemaker site.  Past Medical History:  Diagnosis Date  . Hypertension     Past Surgical History:  Procedure Laterality Date  . ABDOMINAL HYSTERECTOMY    . BLADDER SURGERY    . VEIN LIGATION AND STRIPPING     bilateral legs    No family history on file.  Social History   Socioeconomic History  . Marital status: Single    Spouse name: Not on file  . Number of children: Not on file  . Years of education: Not on file  . Highest education level: Not on file  Occupational History  . Not on file  Tobacco Use  . Smoking status: Never Smoker  Substance and Sexual Activity  . Alcohol use: No  . Drug use: No  . Sexual activity: Not on file  Other Topics Concern  . Not on file  Social History Narrative  . Not on file   Social Determinants of Health   Financial Resource Strain:   . Difficulty of Paying Living Expenses:   Food Insecurity:   . Worried About Charity fundraiser in the Last Year:   . Arboriculturist in the Last Year:   Transportation Needs:   . Film/video editor (Medical):   Marland Kitchen Lack of Transportation (Non-Medical):   Physical Activity:   . Days of Exercise per Week:   . Minutes of Exercise per Session:   Stress:   . Feeling of Stress :   Social Connections:   . Frequency of Communication with Friends and Family:   . Frequency of Social Gatherings with Friends and Family:   . Attends Religious Services:   . Active Member of Clubs or Organizations:   . Attends Archivist Meetings:   Marland Kitchen Marital  Status:   Intimate Partner Violence:   . Fear of Current or Ex-Partner:   . Emotionally Abused:   Marland Kitchen Physically Abused:   . Sexually Abused:      Current Outpatient Medications:  .  hydrochlorothiazide (HYDRODIURIL) 25 MG tablet, Take 25 mg by mouth daily.  , Disp: , Rfl:  .  mometasone (NASONEX) 50 MCG/ACT nasal spray, Place 2 sprays into the nose daily.  , Disp: , Rfl:    Allergies  Allergen Reactions  . Doxycycline Nausea And Vomiting  . Tetanus Toxoids Hives    ROS Review of Systems  Cardiovascular: Positive for palpitations (pt has palpitation  in form  of at fib). Negative for chest pain and leg swelling.  Musculoskeletal: Negative for back pain.  Psychiatric/Behavioral: Negative for dysphoric mood.  All other systems reviewed and are negative.     Objective:    Physical Exam  Constitutional: She appears well-nourished.  HENT:  Head: Normocephalic.  Eyes: Pupils are equal, round, and reactive to light.  Neck: No JVD present. No tracheal deviation present.  Cardiovascular: Normal rate.  No murmur heard. Pulmonary/Chest: Effort normal.  Pacer lt side , no tender  Musculoskeletal:     Cervical back: Normal range  of motion.    There were no vitals taken for this visit. Wt Readings from Last 3 Encounters:  No data found for Wt     Health Maintenance Due  Topic Date Due  . COVID-19 Vaccine (1) Never done  . TETANUS/TDAP  Never done  . DEXA SCAN  Never done  . PNA vac Low Risk Adult (1 of 2 - PCV13) Never done    There are no preventive care reminders to display for this patient.  No results found for: TSH Lab Results  Component Value Date   WBC 8.9 07/20/2011   HGB 13.9 07/20/2011   HCT 42.7 07/20/2011   MCV 92.0 07/20/2011   PLT 225 07/20/2011   Lab Results  Component Value Date   NA 140 07/20/2011   K 3.0 (L) 07/20/2011   CO2 32 07/20/2011   GLUCOSE 167 (H) 07/20/2011   BUN 25 (H) 07/20/2011   CREATININE 1.20 (H) 07/20/2011   CALCIUM 9.8  07/20/2011   No results found for: CHOL No results found for: HDL No results found for: LDLCALC No results found for: TRIG No results found for: CHOLHDL No results found for: HGBA1C    Assessment & Plan:   Problem List Items Addressed This Visit      Cardiovascular and Mediastinum   Paroxysmal atrial fibrillation ([facility])     Other   Cardiac pacemaker in situ - Primary   Relevant Orders   PACEMAKER IN CLINIC CHECK (Completed)     Note: Medical Device Follow-up  Patient pacemaker was interrogated by pacemakers analyzer, battery status is okay.  No programming changes were indicated after the review of the data.  Histogram shows no change since the last interrogation Atrial and ventricular sensing thresholds were found to be acceptable Impedance was checked and it was found to be normal.  Thresholds were found to be okay on evaluation of rhythm problem.  No high rate or low rate arrhythmia were noted.  Estimated battery longevity is 29 months. I have personally reviewed the device data and amended the report as necessary.    No orders of the defined types were placed in this encounter.   Follow-up: Return in about 3 months (around 05/26/2020).    Cletis Athens, MD

## 2020-03-14 ENCOUNTER — Other Ambulatory Visit: Payer: Self-pay | Admitting: *Deleted

## 2020-03-14 MED ORDER — AMLODIPINE BESYLATE 5 MG PO TABS: 5.0000 mg | ORAL_TABLET | Freq: Every day | ORAL | 6 refills | Status: DC

## 2020-03-14 MED ORDER — CALCIUM 1000 + D 1000-800 MG-UNIT PO TABS: 1.0000 | ORAL_TABLET | Freq: Every day | ORAL | 6 refills | Status: DC

## 2020-03-23 ENCOUNTER — Other Ambulatory Visit: Payer: Self-pay

## 2020-03-23 ENCOUNTER — Encounter: Payer: Self-pay | Admitting: Internal Medicine

## 2020-03-23 ENCOUNTER — Ambulatory Visit (INDEPENDENT_AMBULATORY_CARE_PROVIDER_SITE_OTHER): Payer: Medicare Other | Admitting: Internal Medicine

## 2020-03-23 VITALS — BP 134/84 | HR 82 | Wt 139.2 lb

## 2020-03-23 DIAGNOSIS — G5603 Carpal tunnel syndrome, bilateral upper limbs: Secondary | ICD-10-CM | POA: Diagnosis not present

## 2020-03-23 DIAGNOSIS — Z950 Presence of cardiac pacemaker: Secondary | ICD-10-CM

## 2020-03-23 DIAGNOSIS — F419 Anxiety disorder, unspecified: Secondary | ICD-10-CM | POA: Diagnosis not present

## 2020-03-23 DIAGNOSIS — K219 Gastro-esophageal reflux disease without esophagitis: Secondary | ICD-10-CM | POA: Insufficient documentation

## 2020-03-23 DIAGNOSIS — I480 Paroxysmal atrial fibrillation: Secondary | ICD-10-CM

## 2020-03-23 DIAGNOSIS — R0602 Shortness of breath: Secondary | ICD-10-CM

## 2020-03-23 MED ORDER — ALPRAZOLAM 0.25 MG PO TABS: 0.2500 mg | ORAL_TABLET | Freq: Two times a day (BID) | ORAL | 1 refills | Status: DC

## 2020-03-23 MED ORDER — ALPRAZOLAM 0.25 MG PO TABS: 0.2500 mg | ORAL_TABLET | Freq: Two times a day (BID) | ORAL | 0 refills | Status: DC

## 2020-03-23 NOTE — Assessment & Plan Note (Signed)
Patienthas chronic sick sinus syndrome.

## 2020-03-23 NOTE — Assessment & Plan Note (Signed)
Chronic problem.  She takes alprazolam 0.25 mg p.o. at bedtime for that.

## 2020-03-23 NOTE — Assessment & Plan Note (Signed)
Present time will refer to orthopedic surgeon if he gets worse.

## 2020-03-23 NOTE — Assessment & Plan Note (Addendum)
Stable at the present time.  She comes for her pacemaker check every 3 months.

## 2020-03-23 NOTE — Assessment & Plan Note (Signed)
stable °

## 2020-03-23 NOTE — Progress Notes (Signed)
Patient ID: Jessica Browning, female   DOB: 12/01/1933, 86 y.o.   MRN: [id number]    Established Patient Office Visit  Subjective:  Patient ID: Jessica Browning, female    DOB: 26-Sep-1934  Age: 86 y.o. MRN: [id number]  CC:  Chief Complaint  Patient presents with  . Anxiety    Pt here today for alprazolam refill    HPI  Jessica Browning presents for a refill of her Alprazolam, which she takes every night. The patient reports that she gets short of breath while walking, but it does not bother her. She denies chest pain, abdominal pain, eye and ear problems, and leg swelling. She has tendonitis in her hands, bilateral wrist pain, mild depressed mood, and trouble sleeping. She reports heartburn, for which she takes Mylanta. She wears her wrists braces at night, which help her pain. The patient has received her COVID-19 vaccinations. She tries to stay physically active by mowing the lawn. She currently has a pacemaker.   Past Medical History:  Diagnosis Date  . Hypertension     Past Surgical History:  Procedure Laterality Date  . ABDOMINAL HYSTERECTOMY    . BLADDER SURGERY    . VEIN LIGATION AND STRIPPING     bilateral legs    History reviewed. No pertinent family history.  Social History   Socioeconomic History  . Marital status: Single    Spouse name: Not on file  . Number of children: Not on file  . Years of education: Not on file  . Highest education level: Not on file  Occupational History  . Not on file  Tobacco Use  . Smoking status: Never Smoker  Substance and Sexual Activity  . Alcohol use: No  . Drug use: No  . Sexual activity: Not on file  Other Topics Concern  . Not on file  Social History Narrative  . Not on file   Social Determinants of Health   Financial Resource Strain:   . Difficulty of Paying Living Expenses:   Food Insecurity:   . Worried About Charity fundraiser in the Last Year:   . Arboriculturist in the Last Year:   Transportation  Needs:   . Film/video editor (Medical):   Marland Kitchen Lack of Transportation (Non-Medical):   Physical Activity:   . Days of Exercise per Week:   . Minutes of Exercise per Session:   Stress:   . Feeling of Stress :   Social Connections:   . Frequency of Communication with Friends and Family:   . Frequency of Social Gatherings with Friends and Family:   . Attends Religious Services:   . Active Member of Clubs or Organizations:   . Attends Archivist Meetings:   Marland Kitchen Marital Status:   Intimate Partner Violence:   . Fear of Current or Ex-Partner:   . Emotionally Abused:   Marland Kitchen Physically Abused:   . Sexually Abused:      Current Outpatient Medications:  .  ALPRAZolam (XANAX) 0.25 MG tablet, Take 1 tablet (0.25 mg total) by mouth 2 (two) times daily., Disp: 60 tablet, Rfl: 1 .  amLODipine (NORVASC) 5 MG tablet, Take 1 tablet (5 mg total) by mouth daily., Disp: 30 tablet, Rfl: 6 .  atorvastatin (LIPITOR) 10 MG tablet, Take 10 mg by mouth daily., Disp: , Rfl:  .  CALCIUM 1000 + D 1000-800 MG-UNIT TABS, Take 1 tablet by mouth daily., Disp: 30 tablet, Rfl: 6 .  hydrochlorothiazide (HYDRODIURIL) 25 MG  tablet, Take 25 mg by mouth daily.  , Disp: , Rfl:    Allergies  Allergen Reactions  . Doxycycline Nausea And Vomiting  . Tetanus Toxoids Hives    ROS Review of Systems  Constitutional: Negative.   HENT: Negative.   Eyes: Negative.   Respiratory: Positive for shortness of breath.   Cardiovascular: Negative.  Negative for chest pain and leg swelling.  Gastrointestinal: Negative for abdominal pain.       Reports heartburn  Endocrine: Negative.   Genitourinary: Negative.   Musculoskeletal:       Mild tendonitis of the wrists bilaterally.  Skin: Negative.   Allergic/Immunologic: Negative.   Neurological: Negative.   Hematological: Negative.   Psychiatric/Behavioral: Positive for dysphoric mood.      Objective:    Physical Exam  Constitutional: The patient is oriented to  person, place, and time. Pt appears well-developed and well-nourished.  Head: Normocephalic and atraumatic.  Eyes: Pupils are equal, round, and reactive to light.  Neck: No JVD present. No tracheal deviation present. No thyromegaly present.  Cardiovascular: Regular rate and rhythm. No gallop. Pulmonary/Chest: Normal breath sounds. Lungs clear to auscultation. Abdominal: Soft. No abdominal tenderness. No guarding or rebound tenderness. No hepatosplenomegaly. Musculoskeletal: Normal range of motion.  Lymphatic: No cervical adenopathy.  Neurological: No cranial nerve deficit.  Skin: Skin is warm and hydrated. 1/2-1+ edema in her right leg. Psychiatric: The patient has a normal mood and affect.  BP 134/84   Pulse 82   Wt 139 lb 3.2 oz (63.1 kg)  Wt Readings from Last 3 Encounters:  03/23/20 139 lb 3.2 oz (63.1 kg)     Health Maintenance Due  Topic Date Due  . COVID-19 Vaccine (1) Never done  . TETANUS/TDAP  Never done  . DEXA SCAN  Never done  . PNA vac Low Risk Adult (1 of 2 - PCV13) Never done    There are no preventive care reminders to display for this patient.  No results found for: TSH Lab Results  Component Value Date   WBC 8.9 07/20/2011   HGB 13.9 07/20/2011   HCT 42.7 07/20/2011   MCV 92.0 07/20/2011   PLT 225 07/20/2011   Lab Results  Component Value Date   NA 140 07/20/2011   K 3.0 (L) 07/20/2011   CO2 32 07/20/2011   GLUCOSE 167 (H) 07/20/2011   BUN 25 (H) 07/20/2011   CREATININE 1.20 (H) 07/20/2011   CALCIUM 9.8 07/20/2011   No results found for: CHOL No results found for: HDL No results found for: LDLCALC No results found for: TRIG No results found for: CHOLHDL No results found for: HGBA1C    Assessment & Plan:   Problem List Items Addressed This Visit      Cardiovascular and Mediastinum   Paroxysmal atrial fibrillation ([facility])    stable      Relevant Medications   atorvastatin (LIPITOR) 10 MG tablet     Digestive   Gastroesophageal  reflux disease without esophagitis     Nervous and Auditory   Bilateral carpal tunnel syndrome    Present time will refer to orthopedic surgeon if he gets worse.      Relevant Medications   ALPRAZolam (XANAX) 0.25 MG tablet     Other   Cardiac pacemaker in situ    Stable at the present time.  She comes for her pacemaker check every 3 months.      Anxiety - Primary    Chronic problem.  She takes alprazolam 0.25  mg p.o. at bedtime for that.      Relevant Medications   ALPRAZolam (XANAX) 0.25 MG tablet   Shortness of breath    Patienthas chronic sick sinus syndrome.         Meds ordered this encounter  Medications  . DISCONTD: ALPRAZolam (XANAX) 0.25 MG tablet    Sig: Take 1 tablet (0.25 mg total) by mouth 2 (two) times daily.    Dispense:  30 tablet    Refill:  0  . DISCONTD: ALPRAZolam (XANAX) 0.25 MG tablet    Sig: Take 1 tablet (0.25 mg total) by mouth 2 (two) times daily.    Dispense:  60 tablet    Refill:  1  . ALPRAZolam (XANAX) 0.25 MG tablet    Sig: Take 1 tablet (0.25 mg total) by mouth 2 (two) times daily.    Dispense:  60 tablet    Refill:  1   The patient presents today with wrist pain, which is likely carpal tunnel syndrome. She reports a slightly depressed mood and mild shortness of breath while walking, but neither of these bother her. She takes Mylanta for heartburn. She would like a refill of her Alprazolam today. If  wrist pain worsens, will refer her to an orthopedic surgeon. She is having no problems with her pacemaker. The patient has no further questions or complaints at this time and is agreeable with the plan. She will follow up in 2 months.  Follow-up: Return in about 2 months (around 05/23/2020).   By signing my name below, I, De Burrs, attest that this documentation has been prepared under the direction and in the presence of Cletis Athens, MD. Electronically Signed: De Burrs, Medical Scribe. 03/23/20. 10:54 AM.  I personally  performed the services described in this documentation, which was SCRIBED in my presence. The recorded information has been reviewed and considered accurate. It has been edited as necessary during review. Cletis Athens, MD

## 2020-05-02 ENCOUNTER — Ambulatory Visit: Payer: PRIVATE HEALTH INSURANCE | Admitting: Internal Medicine

## 2020-05-23 ENCOUNTER — Other Ambulatory Visit: Payer: Self-pay

## 2020-05-23 ENCOUNTER — Encounter: Payer: Self-pay | Admitting: Internal Medicine

## 2020-05-23 ENCOUNTER — Ambulatory Visit (INDEPENDENT_AMBULATORY_CARE_PROVIDER_SITE_OTHER): Payer: Medicare Other | Admitting: Internal Medicine

## 2020-05-23 VITALS — BP 139/91 | HR 94 | Ht 64.0 in | Wt 138.0 lb

## 2020-05-23 DIAGNOSIS — I480 Paroxysmal atrial fibrillation: Secondary | ICD-10-CM | POA: Diagnosis not present

## 2020-05-23 DIAGNOSIS — R350 Frequency of micturition: Secondary | ICD-10-CM

## 2020-05-23 DIAGNOSIS — K219 Gastro-esophageal reflux disease without esophagitis: Secondary | ICD-10-CM

## 2020-05-23 DIAGNOSIS — F419 Anxiety disorder, unspecified: Secondary | ICD-10-CM

## 2020-05-23 DIAGNOSIS — G5603 Carpal tunnel syndrome, bilateral upper limbs: Secondary | ICD-10-CM | POA: Diagnosis not present

## 2020-05-23 LAB — POCT URINALYSIS DIPSTICK
Bilirubin, UA: NEGATIVE
Blood, UA: NEGATIVE
Glucose, UA: NEGATIVE
Ketones, UA: NEGATIVE
Leukocytes, UA: NEGATIVE
Nitrite, UA: NEGATIVE
Protein, UA: NEGATIVE
Spec Grav, UA: 1.015 (ref 1.010–1.025)
Urobilinogen, UA: 0.2 E.U./dL
pH, UA: 7 (ref 5.0–8.0)

## 2020-05-23 MED ORDER — ALPRAZOLAM 0.25 MG PO TABS: 0.2500 mg | ORAL_TABLET | Freq: Two times a day (BID) | ORAL | 1 refills | Status: DC

## 2020-05-23 NOTE — Assessment & Plan Note (Signed)
The patient wears a brace at night when sleeping, which provides relief.

## 2020-05-23 NOTE — Assessment & Plan Note (Signed)
-   Patient experiencing high levels of anxiety.  - Encouraged patient to engage in relaxing activities like yoga, meditation, journaling, going for a walk, or participating in a hobby.  - Encouraged patient to reach out to trusted friends or family members about recent struggles 

## 2020-05-23 NOTE — Assessment & Plan Note (Signed)
Urine was normal today.

## 2020-05-23 NOTE — Assessment & Plan Note (Signed)
-   The patient's GERD is stable on medication.  - Instructed the patient to avoid eating spicy and acidic foods, as well as foods high in fat. - Instructed the patient to avoid eating large meals or meals 2-3 hours prior to sleeping. 

## 2020-05-23 NOTE — Assessment & Plan Note (Signed)
The patient has a pacemaker for sick sinus syndrome. There is no evidence of recurrence atrial fibrillation. We are going to check her pacemaker again in two days. If there is any evidence of atrial fibrillation, we will discuss treatment options at that time.

## 2020-05-23 NOTE — Progress Notes (Signed)
Established Patient Office Visit  SUBJECTIVE:  Subjective  Patient ID: Jessica Browning, female    DOB: 11/22/1933  Age: 86 y.o. MRN: [id number]  CC:  Chief Complaint  Patient presents with  . Anxiety    refill of xanax   . Urinary Frequency    HPI Jessica Browning is a 86 y.o. female presenting today for Xanax refill. She states that she is feeling well and is staying active. She mowed her 1-acre yard over the weekend. She has no complaints at this time. She denies chest pain, abdominal pain, constipation, leg swelling, difficulty swallowing, and shortness of breath.  She denies smoking cigarettes. She has been fully vaccinated against COVID-19.  Past Medical History:  Diagnosis Date  . Hypertension     Past Surgical History:  Procedure Laterality Date  . ABDOMINAL HYSTERECTOMY    . BLADDER SURGERY    . VEIN LIGATION AND STRIPPING     bilateral legs    History reviewed. No pertinent family history.  Social History   Socioeconomic History  . Marital status: Single    Spouse name: Not on file  . Number of children: Not on file  . Years of education: Not on file  . Highest education level: Not on file  Occupational History  . Not on file  Tobacco Use  . Smoking status: Never Smoker  . Smokeless tobacco: Never Used  Substance and Sexual Activity  . Alcohol use: Never  . Drug use: Never  . Sexual activity: Not Currently  Other Topics Concern  . Not on file  Social History Narrative  . Not on file   Social Determinants of Health   Financial Resource Strain:   . Difficulty of Paying Living Expenses:   Food Insecurity:   . Worried About Charity fundraiser in the Last Year:   . Arboriculturist in the Last Year:   Transportation Needs:   . Film/video editor (Medical):   Marland Kitchen Lack of Transportation (Non-Medical):   Physical Activity:   . Days of Exercise per Week:   . Minutes of Exercise per Session:   Stress:   . Feeling of Stress :   Social  Connections:   . Frequency of Communication with Friends and Family:   . Frequency of Social Gatherings with Friends and Family:   . Attends Religious Services:   . Active Member of Clubs or Organizations:   . Attends Archivist Meetings:   Marland Kitchen Marital Status:   Intimate Partner Violence:   . Fear of Current or Ex-Partner:   . Emotionally Abused:   Marland Kitchen Physically Abused:   . Sexually Abused:      Current Outpatient Medications:  .  ALPRAZolam (XANAX) 0.25 MG tablet, Take 1 tablet (0.25 mg total) by mouth 2 (two) times daily., Disp: 60 tablet, Rfl: 1 .  amLODipine (NORVASC) 5 MG tablet, Take 1 tablet (5 mg total) by mouth daily., Disp: 30 tablet, Rfl: 6 .  aspirin EC 81 MG tablet, Take 81 mg by mouth daily. Swallow whole., Disp: , Rfl:  .  atorvastatin (LIPITOR) 10 MG tablet, Take 10 mg by mouth daily., Disp: , Rfl:  .  CALCIUM 1000 + D 1000-800 MG-UNIT TABS, Take 1 tablet by mouth daily., Disp: 30 tablet, Rfl: 6 .  EPINEPHrine (EPIPEN 2-PAK) 0.3 mg/0.3 mL IJ SOAJ injection, Inject 0.3 mg into the muscle as needed for anaphylaxis., Disp: , Rfl:  .  hydrochlorothiazide (HYDRODIURIL) 25 MG tablet, Take  25 mg by mouth daily.  , Disp: , Rfl:    Allergies  Allergen Reactions  . Doxycycline Nausea And Vomiting  . Tetanus Toxoids Hives    ROS Review of Systems  Constitutional: Negative.   HENT: Negative for trouble swallowing.   Eyes: Negative.   Respiratory: Negative for shortness of breath.   Cardiovascular: Negative for chest pain and leg swelling.  Gastrointestinal: Negative for abdominal pain and constipation.  Endocrine: Negative.   Genitourinary: Negative.   Musculoskeletal: Negative.   Skin: Negative.   Allergic/Immunologic: Negative.   Neurological: Negative.   Hematological: Negative.   Psychiatric/Behavioral: Negative.   All other systems reviewed and are negative.    OBJECTIVE:    Physical Exam Vitals reviewed.  Constitutional:      Appearance: Normal  appearance.  HENT:     Mouth/Throat:     Mouth: Mucous membranes are moist.  Eyes:     Pupils: Pupils are equal, round, and reactive to light.  Neck:     Vascular: No carotid bruit.  Cardiovascular:     Rate and Rhythm: Normal rate and regular rhythm.     Pulses: Normal pulses.     Heart sounds: Normal heart sounds.  Pulmonary:     Effort: Pulmonary effort is normal.     Breath sounds: Normal breath sounds.  Abdominal:     General: Bowel sounds are normal.     Palpations: Abdomen is soft. There is no hepatomegaly, splenomegaly or mass.     Tenderness: There is no abdominal tenderness.     Hernia: No hernia is present.  Musculoskeletal:        General: No tenderness.     Cervical back: Neck supple.     Right lower leg: No edema.     Left lower leg: No edema.  Skin:    Findings: No rash.  Neurological:     Mental Status: She is alert and oriented to person, place, and time.     Motor: No weakness.  Psychiatric:        Mood and Affect: Mood and affect normal.        Behavior: Behavior normal.     BP (!) 139/91   Pulse 94   Ht 5' 4"  (1.626 m)   Wt 138 lb (62.6 kg)   BMI 23.69 kg/m  Wt Readings from Last 3 Encounters:  05/23/20 138 lb (62.6 kg)  03/23/20 139 lb 3.2 oz (63.1 kg)    Health Maintenance Due  Topic Date Due  . COVID-19 Vaccine (1) Never done  . TETANUS/TDAP  Never done  . DEXA SCAN  Never done  . PNA vac Low Risk Adult (1 of 2 - PCV13) Never done  . INFLUENZA VACCINE  05/21/2020    There are no preventive care reminders to display for this patient.  CBC Latest Ref Rng & Units 07/20/2011 06/28/2010  WBC 4.0 - 10.5 K/uL 8.9 9.9  Hemoglobin 12.0 - 15.0 g/dL 13.9 13.6  Hematocrit 36 - 46 % 42.7 41.1  Platelets 150 - 400 K/uL 225 178   CMP Latest Ref Rng & Units 07/20/2011 06/28/2010  Glucose 70 - 99 mg/dL 167(H) 167(H)  BUN 6 - 23 mg/dL 25(H) 17  Creatinine 0.50 - 1.10 mg/dL 1.20(H) 1.26(H)  Sodium 135 - 145 mEq/L 140 140  Potassium 3.5 - 5.1 mEq/L  3.0(L) 2.9(L)  Chloride 96 - 112 mEq/L 99 98  CO2 19 - 32 mEq/L 32 33(H)  Calcium 8.4 - 10.5 mg/dL 9.8  9.2    No results found for: TSH No results found for: ALBUMIN, ANIONGAP, EGFR, GFR No results found for: CHOL, HDL, LDLCALC, CHOLHDL No results found for: TRIG No results found for: HGBA1C    ASSESSMENT & PLAN:   Problem List Items Addressed This Visit      Cardiovascular and Mediastinum   Paroxysmal atrial fibrillation [facility])    The patient has a pacemaker for sick sinus syndrome. There is no evidence of recurrence atrial fibrillation. We are going to check her pacemaker again in two days. If there is any evidence of atrial fibrillation, we will discuss treatment options at that time.      Relevant Medications   aspirin EC 81 MG tablet   EPINEPHrine (EPIPEN 2-PAK) 0.3 mg/0.3 mL IJ SOAJ injection     Digestive   Gastroesophageal reflux disease without esophagitis    - The patient's GERD is stable on medication.  - Instructed the patient to avoid eating spicy and acidic foods, as well as foods high in fat. - Instructed the patient to avoid eating large meals or meals 2-3 hours prior to sleeping.         Nervous and Auditory   Bilateral carpal tunnel syndrome    The patient wears a brace at night when sleeping, which provides relief.       Relevant Medications   ALPRAZolam (XANAX) 0.25 MG tablet     Other   Anxiety    - Patient experiencing high levels of anxiety.  - Encouraged patient to engage in relaxing activities like yoga, meditation, journaling, going for a walk, or participating in a hobby.  - Encouraged patient to reach out to trusted friends or family members about recent struggles       Relevant Medications   ALPRAZolam (XANAX) 0.25 MG tablet   Frequency of urination - Primary    Urine was normal today.       Relevant Orders   POCT urinalysis dipstick (Completed)      Meds ordered this encounter  Medications  . ALPRAZolam (XANAX) 0.25 MG  tablet    Sig: Take 1 tablet (0.25 mg total) by mouth 2 (two) times daily.    Dispense:  60 tablet    Refill:  1    Follow-up: Return in about 2 days (around 05/25/2020) for Pacemaker check.    Dr. Jane Canary [facility] [address], [place], [place] [address]   By signing my name below, I, Clerance Lav, attest that this documentation has been prepared under the direction and in the presence of Cletis Athens, MD. Electronically Signed: Cletis Athens, MD 05/23/20, 10:37 AM   I personally performed the services described in this documentation, which was SCRIBED in my presence. The recorded information has been reviewed and considered accurate. It has been edited as necessary during review. Cletis Athens, MD

## 2020-05-23 NOTE — Addendum Note (Signed)
Addended by: Alois Cliche on: 05/23/2020 11:39 AM   Modules accepted: Orders

## 2020-05-25 ENCOUNTER — Ambulatory Visit (INDEPENDENT_AMBULATORY_CARE_PROVIDER_SITE_OTHER): Payer: Medicare Other | Admitting: Internal Medicine

## 2020-05-25 ENCOUNTER — Other Ambulatory Visit: Payer: Self-pay

## 2020-05-25 VITALS — BP 132/78 | HR 81

## 2020-05-25 DIAGNOSIS — I480 Paroxysmal atrial fibrillation: Secondary | ICD-10-CM

## 2020-05-25 DIAGNOSIS — R350 Frequency of micturition: Secondary | ICD-10-CM | POA: Diagnosis not present

## 2020-05-25 DIAGNOSIS — F419 Anxiety disorder, unspecified: Secondary | ICD-10-CM

## 2020-05-25 DIAGNOSIS — Z950 Presence of cardiac pacemaker: Secondary | ICD-10-CM

## 2020-05-25 MED ORDER — NITROFURANTOIN MACROCRYSTAL 100 MG PO CAPS: 100.0000 mg | ORAL_CAPSULE | Freq: Two times a day (BID) | ORAL | 1 refills | Status: DC

## 2020-05-25 NOTE — Progress Notes (Signed)
Established Patient Office Visit  SUBJECTIVE:  Subjective  Patient ID: Jessica Browning, female    DOB: [birth year]  Age: 86 y.o. MRN: [id number]  CC:  Chief Complaint  Patient presents with  . Pacemaker Check    HPI Jessica Browning is a 86 y.o. female presenting today for pacemaker check  Patient pacemaker was interrogated by pacemakers analyzer, battery status is okay.  No programming changes were indicated after the review of the data.  Histogram shows no change since the last interrogation Atrial and ventricular sensing thresholds were found to be acceptable Impedance was checked and it was found to be normal.  Thresholds were found to be okay on evaluation of rhythm problem.  No high rate or low rate arrhythmia were noted.  Estimated battery longevity is 29 months. I have personally reviewed the device data and amended the report as necessary.   She also complains of bladder frequency, difficulty voiding, with some dysuria. She denies fever. She has been drinking cranberry juice to try and help alleviate her symptoms without relief. She has been experiencing this discomfort for about a week now. Urinalysis completed one week ago was benign.   Past Medical History:  Diagnosis Date  . Hypertension     Past Surgical History:  Procedure Laterality Date  . ABDOMINAL HYSTERECTOMY    . BLADDER SURGERY    . VEIN LIGATION AND STRIPPING     bilateral legs    History reviewed. No pertinent family history.  Social History   Socioeconomic History  . Marital status: Single    Spouse name: Not on file  . Number of children: Not on file  . Years of education: Not on file  . Highest education level: Not on file  Occupational History  . Not on file  Tobacco Use  . Smoking status: Never Smoker  . Smokeless tobacco: Never Used  Substance and Sexual Activity  . Alcohol use: Never  . Drug use: Never  . Sexual activity: Not Currently  Other Topics Concern  . Not on file    Social History Narrative  . Not on file   Social Determinants of Health   Financial Resource Strain:   . Difficulty of Paying Living Expenses:   Food Insecurity:   . Worried About Charity fundraiser in the Last Year:   . Arboriculturist in the Last Year:   Transportation Needs:   . Film/video editor (Medical):   Marland Kitchen Lack of Transportation (Non-Medical):   Physical Activity:   . Days of Exercise per Week:   . Minutes of Exercise per Session:   Stress:   . Feeling of Stress :   Social Connections:   . Frequency of Communication with Friends and Family:   . Frequency of Social Gatherings with Friends and Family:   . Attends Religious Services:   . Active Member of Clubs or Organizations:   . Attends Archivist Meetings:   Marland Kitchen Marital Status:   Intimate Partner Violence:   . Fear of Current or Ex-Partner:   . Emotionally Abused:   Marland Kitchen Physically Abused:   . Sexually Abused:      Current Outpatient Medications:  .  ALPRAZolam (XANAX) 0.25 MG tablet, Take 1 tablet (0.25 mg total) by mouth 2 (two) times daily., Disp: 60 tablet, Rfl: 1 .  amLODipine (NORVASC) 5 MG tablet, Take 1 tablet (5 mg total) by mouth daily., Disp: 30 tablet, Rfl: 6 .  aspirin EC 81 MG  tablet, Take 81 mg by mouth daily. Swallow whole., Disp: , Rfl:  .  atorvastatin (LIPITOR) 10 MG tablet, Take 10 mg by mouth daily., Disp: , Rfl:  .  CALCIUM 1000 + D 1000-800 MG-UNIT TABS, Take 1 tablet by mouth daily., Disp: 30 tablet, Rfl: 6 .  EPINEPHrine (EPIPEN 2-PAK) 0.3 mg/0.3 mL IJ SOAJ injection, Inject 0.3 mg into the muscle as needed for anaphylaxis., Disp: , Rfl:    Allergies  Allergen Reactions  . Doxycycline Nausea And Vomiting  . Tetanus Toxoids Hives    ROS Review of Systems  Constitutional: Negative.  Negative for fever.  HENT: Negative.   Eyes: Negative.   Respiratory: Negative.   Cardiovascular: Negative.   Gastrointestinal: Negative.   Endocrine: Negative.   Genitourinary: Positive  for difficulty urinating, dysuria and frequency.  Musculoskeletal: Negative.   Skin: Negative.   Allergic/Immunologic: Negative.   Neurological: Negative.   Hematological: Negative.   Psychiatric/Behavioral: Negative.   All other systems reviewed and are negative.    OBJECTIVE:    Physical Exam Vitals reviewed.  Constitutional:      Appearance: Normal appearance.  HENT:     Mouth/Throat:     Mouth: Mucous membranes are moist.  Eyes:     Pupils: Pupils are equal, round, and reactive to light.  Neck:     Vascular: No carotid bruit.  Cardiovascular:     Rate and Rhythm: Normal rate and regular rhythm.     Pulses: Normal pulses.     Heart sounds: Normal heart sounds.  Pulmonary:     Effort: Pulmonary effort is normal.     Breath sounds: Normal breath sounds.  Abdominal:     General: Bowel sounds are normal.     Palpations: Abdomen is soft. There is no hepatomegaly, splenomegaly or mass.     Tenderness: There is no abdominal tenderness.     Hernia: No hernia is present.  Musculoskeletal:        General: No tenderness.     Cervical back: Neck supple.     Right lower leg: No edema.     Left lower leg: No edema.  Skin:    Findings: No rash.  Neurological:     Mental Status: She is alert and oriented to person, place, and time.     Motor: No weakness.  Psychiatric:        Mood and Affect: Mood and affect normal.        Behavior: Behavior normal.     BP 132/78   Pulse 81  Wt Readings from Last 3 Encounters:  05/23/20 138 lb (62.6 kg)  03/23/20 139 lb 3.2 oz (63.1 kg)    Health Maintenance Due  Topic Date Due  . COVID-19 Vaccine (1) Never done  . TETANUS/TDAP  Never done  . DEXA SCAN  Never done  . PNA vac Low Risk Adult (1 of 2 - PCV13) Never done  . INFLUENZA VACCINE  05/21/2020    There are no preventive care reminders to display for this patient.  CBC Latest Ref Rng & Units 07/20/2011 06/28/2010  WBC 4.0 - 10.5 K/uL 8.9 9.9  Hemoglobin 12.0 - 15.0 g/dL  13.9 13.6  Hematocrit 36 - 46 % 42.7 41.1  Platelets 150 - 400 K/uL 225 178   CMP Latest Ref Rng & Units 07/20/2011 06/28/2010  Glucose 70 - 99 mg/dL 167(H) 167(H)  BUN 6 - 23 mg/dL 25(H) 17  Creatinine 0.50 - 1.10 mg/dL 1.20(H) 1.26(H)  Sodium 135 -  145 mEq/L 140 140  Potassium 3.5 - 5.1 mEq/L 3.0(L) 2.9(L)  Chloride 96 - 112 mEq/L 99 98  CO2 19 - 32 mEq/L 32 33(H)  Calcium 8.4 - 10.5 mg/dL 9.8 9.2   Urinalysis    Component Value Date/Time   BILIRUBINUR Negative 05/23/2020 1002   PROTEINUR Negative 05/23/2020 1002   UROBILINOGEN 0.2 05/23/2020 1002   NITRITE Negative 05/23/2020 1002   LEUKOCYTESUR Negative 05/23/2020 1002    No results found for: TSH No results found for: ALBUMIN, ANIONGAP, EGFR, GFR No results found for: CHOL, HDL, LDLCALC, CHOLHDL No results found for: TRIG No results found for: HGBA1C    ASSESSMENT & PLAN:   Problem List Items Addressed This Visit      Cardiovascular and Mediastinum   Paroxysmal atrial fibrillation ([place])    Stable at the present time pacemaker is working well no ventricular arrhythmia was noted        Other   Cardiac pacemaker in situ    Cardiac pacemaker is working well      Relevant Orders   [place] (Completed)   Anxiety    - Patient experiencing high levels of anxiety.  - Encouraged patient to engage in relaxing activities like yoga, meditation, journaling, going for a walk, or participating in a hobby.  - Encouraged patient to reach out to trusted friends or family members about recent struggles       Frequency of urination - Primary    Urine dipstick examination was abnrmal treated  For uti.         Meds ordered this encounter  Medications  . nitrofurantoin (MACRODANTIN) 100 MG capsule    Sig: Take 1 capsule (100 mg total) by mouth 2 (two) times daily for 7 days.    Dispense:  14 capsule    Refill:  1      Follow-up: No follow-ups on file.    Dr. Jane Canary [facility] [address] [place], [place] [address]   By signing my name below, I, [employer], attest that this documentation has been prepared under the direction and in the presence of Cletis Athens, MD. Electronically Signed: Cletis Athens, MD 06/03/20, 4:57 PM   I personally performed the services described in this documentation, which was SCRIBED in my presence. The recorded information has been reviewed and considered accurate. It has been edited as necessary during review. Cletis Athens, MD

## 2020-06-03 NOTE — Assessment & Plan Note (Signed)
Stable at the present time pacemaker is working well no ventricular arrhythmia was noted

## 2020-06-03 NOTE — Assessment & Plan Note (Addendum)
Urine dipstick examination was abnrmal treated  For uti.

## 2020-06-03 NOTE — Assessment & Plan Note (Signed)
-   Patient experiencing high levels of anxiety.  - Encouraged patient to engage in relaxing activities like yoga, meditation, journaling, going for a walk, or participating in a hobby.  - Encouraged patient to reach out to trusted friends or family members about recent struggles 

## 2020-06-03 NOTE — Assessment & Plan Note (Signed)
Cardiac pacemaker is working well

## 2020-06-08 ENCOUNTER — Ambulatory Visit (INDEPENDENT_AMBULATORY_CARE_PROVIDER_SITE_OTHER): Payer: Medicare Other | Admitting: Internal Medicine

## 2020-06-08 ENCOUNTER — Other Ambulatory Visit: Payer: Self-pay

## 2020-06-08 DIAGNOSIS — E782 Mixed hyperlipidemia: Secondary | ICD-10-CM

## 2020-06-08 DIAGNOSIS — K219 Gastro-esophageal reflux disease without esophagitis: Secondary | ICD-10-CM

## 2020-06-08 DIAGNOSIS — R350 Frequency of micturition: Secondary | ICD-10-CM | POA: Diagnosis not present

## 2020-06-08 DIAGNOSIS — R0602 Shortness of breath: Secondary | ICD-10-CM | POA: Diagnosis not present

## 2020-06-08 DIAGNOSIS — F419 Anxiety disorder, unspecified: Secondary | ICD-10-CM | POA: Diagnosis not present

## 2020-06-08 LAB — CBC WITH DIFFERENTIAL/PLATELET
Absolute Monocytes: 666 cells/uL (ref 200–950)
Basophils Absolute: 21 cells/uL (ref 0–200)
Basophils Relative: 0.4 %
Eosinophils Absolute: 151 cells/uL (ref 15–500)
Eosinophils Relative: 2.9 %
HCT: 38.9 % (ref 35.0–45.0)
Hemoglobin: 12.6 g/dL (ref 11.7–15.5)
Lymphs Abs: 1633 cells/uL (ref 850–3900)
MCH: 29 pg (ref 27.0–33.0)
MCHC: 32.4 g/dL (ref 32.0–36.0)
MCV: 89.6 fL (ref 80.0–100.0)
MPV: 10.8 fL (ref 7.5–12.5)
Monocytes Relative: 12.8 %
Neutro Abs: 2730 cells/uL (ref 1500–7800)
Neutrophils Relative %: 52.5 %
Platelets: 237 10*3/uL (ref 140–400)
RBC: 4.34 10*6/uL (ref 3.80–5.10)
RDW: 13.1 % (ref 11.0–15.0)
Total Lymphocyte: 31.4 %
WBC: 5.2 10*3/uL (ref 3.8–10.8)

## 2020-06-08 LAB — LIPID PANEL
Cholesterol: 149 mg/dL (ref <200)
HDL: 57 mg/dL (ref >=50)
LDL Cholesterol (Calc): 76 mg/dL (calc)
Non-HDL Cholesterol (Calc): 92 mg/dL (calc) (ref <130)
Total CHOL/HDL Ratio: 2.6 (calc) (ref <5.0)
Triglycerides: 80 mg/dL (ref <150)

## 2020-06-08 LAB — COMPLETE METABOLIC PANEL WITH GFR
AG Ratio: 1.8 (calc) (ref 1.0–2.5)
ALT: 9 U/L (ref 6–29)
AST: 18 U/L (ref 10–35)
Albumin: 4.2 g/dL (ref 3.6–5.1)
Alkaline phosphatase (APISO): 93 U/L (ref 37–153)
BUN/Creatinine Ratio: 10 (calc) (ref 6–22)
BUN: 9 mg/dL (ref 7–25)
CO2: 27 mmol/L (ref 20–32)
Calcium: 9.9 mg/dL (ref 8.6–10.4)
Chloride: 101 mmol/L (ref 98–110)
Creat: 0.92 mg/dL — ABNORMAL HIGH (ref 0.60–0.88)
GFR, Est African American: 67 mL/min/{1.73_m2} (ref >=60)
GFR, Est Non African American: 58 mL/min/{1.73_m2} — ABNORMAL LOW (ref >=60)
Globulin: 2.3 g/dL (calc) (ref 1.9–3.7)
Glucose, Bld: 100 mg/dL — ABNORMAL HIGH (ref 65–99)
Potassium: 4.5 mmol/L (ref 3.5–5.3)
Sodium: 138 mmol/L (ref 135–146)
Total Bilirubin: 0.6 mg/dL (ref 0.2–1.2)
Total Protein: 6.5 g/dL (ref 6.1–8.1)

## 2020-06-08 LAB — TSH: TSH: 4.97 mIU/L — ABNORMAL HIGH (ref 0.40–4.50)

## 2020-06-12 ENCOUNTER — Other Ambulatory Visit: Payer: Self-pay

## 2020-06-12 ENCOUNTER — Ambulatory Visit (INDEPENDENT_AMBULATORY_CARE_PROVIDER_SITE_OTHER): Payer: Medicare Other | Admitting: Dermatology

## 2020-06-12 DIAGNOSIS — L720 Epidermal cyst: Secondary | ICD-10-CM

## 2020-06-12 DIAGNOSIS — Z85828 Personal history of other malignant neoplasm of skin: Secondary | ICD-10-CM

## 2020-06-12 NOTE — Patient Instructions (Addendum)
Pre-Operative Instructions  You are scheduled for a surgical procedure at [facility]. We recommend you read the following instructions. If you have any questions or concerns, please call the office at [phone].  1. Shower and wash the entire body with soap and water the day of your surgery paying special attention to cleansing at and around the planned surgery site.  2. Avoid aspirin or aspirin containing products at least fourteen (14) days prior to your surgical procedure and for at least one week (7 Days) after your surgical procedure. If you take aspirin on a regular basis for heart disease or history of stroke or for any other reason, we may recommend you continue taking aspirin but please notify us if you take this on a regular basis. Aspirin can cause more bleeding to occur during surgery as well as prolonged bleeding and bruising after surgery.   3. Avoid other nonsteroidal pain medications at least one week prior to surgery and at least one week prior to your surgery. These include medications such as Ibuprofen (Motrin, Advil and Nuprin), Naprosyn, Voltaren, Relafen, etc. If medications are used for therapeutic reasons, please inform us as they can cause increased bleeding or prolonged bleeding during and bruising after surgical procedures.   4. Please advice us if you are taking any "blood thinner" medications such as Coumadin or Dipyridamole or Plavix or similar medications. These cause increased bleeding and prolonged bleeding during and bruising after surgical procedures. We may have to consider discontinuing these medications briefly prior to and shortly after your surgery, if safe to do so.   5. Please inform us of all medications you are currently taking. All medications that are taken regularly should be taken the day of surgery as you always do. Nevertheless, we need to be informed of what medications you are taking prior to surgery to whether they will affect the  procedure or cause any complications.   6. Please inform us of any medication allergies. Also inform us of whether you have allergies to Latex or rubber products or whether you have had any adverse reaction to Lidocaine or Epinephrine.  7. Please inform us of any prosthetic or artificial body parts such as artificial heart valve, joint replacements, etc., or similar condition that might require preoperative antibiotics.   8. We recommend avoidance of alcohol at least two weeks prior to surgery and continued avoidence for at least two weeks after surgery.   9. We recommend discontinuation of tobacco smoking at least two weeks prior to surgery and continued abstinence for at least two weeks after surgery.  10. Do not plan strenuous exercise, strenuous work or strenuous lifting for approximately four weeks after your surgery.   11. We request if you are unable to make your scheduled surgical appointment, please call us at least a week in advance or as soon as you are aware of a problem sot aht we can cancel or reschedule you.   12. You MAKE TAKE TYLENOL (acetaminophen) for pain as it is not a blood thinner.   13. PLEASE PLAN TO BE IN TOWN FOR TWO WEEKS FOLLOWING SURGERY, THIS IS IMPORTANT SO YOU CAN BE CHECKED FOR DRESSING CHANGES, SUTURE REMOVAL AND TO MONITOR FOR POSSIBLE COMPLICATIONS.   

## 2020-06-12 NOTE — Progress Notes (Signed)
   New Patient Visit  Subjective  Jessica Browning is a 86 y.o. female who presents for the following: Knot (back x 1year, itchy. Patient states this was removed in 2012.). But it came back. She also has a history of BCC on the posterior neck in 2017.    The following portions of the chart were reviewed this encounter and updated as appropriate:      Review of Systems:  No other skin or systemic complaints except as noted in HPI or Assessment and Plan.  Objective  Well appearing patient in no apparent distress; mood and affect are within normal limits.  A focused examination was performed including back. Relevant physical exam findings are noted in the Assessment and Plan.  Objective  Spinal Mid Lower Back: 2.0cm Pink white firm subcutaneous nodule with punctum.    Assessment & Plan   History of Basal Cell Carcinoma of the Skin - No evidence of recurrence today - Recommend regular full body skin exams - Recommend daily broad spectrum sunscreen SPF 30+ to sun-exposed areas, reapply every 2 hours as needed.  - Call if any new or changing lesions are noted between office visits   Epidermal inclusion cyst Spinal Mid Lower Back  With pruritus. Cyst with symptoms and/or recent change.  Discussed surgical excision to remove, including resulting scar and possible recurrence.  Patient will schedule for surgery. Pre-op information given.     Return for surgery - cyst on spinal mid lower back.  IJamesetta Orleans, CMA, am acting as scribe for Brendolyn Patty, MD .  Documentation: I have reviewed the above documentation for accuracy and completeness, and I agree with the above.  Brendolyn Patty MD

## 2020-06-16 ENCOUNTER — Ambulatory Visit: Payer: 59 | Admitting: Internal Medicine

## 2020-06-17 ENCOUNTER — Other Ambulatory Visit: Payer: Self-pay | Admitting: Internal Medicine

## 2020-06-26 ENCOUNTER — Other Ambulatory Visit: Payer: Self-pay | Admitting: Internal Medicine

## 2020-06-26 DIAGNOSIS — R350 Frequency of micturition: Secondary | ICD-10-CM

## 2020-06-29 ENCOUNTER — Ambulatory Visit (INDEPENDENT_AMBULATORY_CARE_PROVIDER_SITE_OTHER): Payer: Medicare Other | Admitting: Internal Medicine

## 2020-06-29 ENCOUNTER — Other Ambulatory Visit: Payer: Self-pay

## 2020-06-29 ENCOUNTER — Encounter: Payer: Self-pay | Admitting: Internal Medicine

## 2020-06-29 VITALS — BP 129/85 | HR 76 | Ht 65.0 in | Wt 129.0 lb

## 2020-06-29 DIAGNOSIS — G5603 Carpal tunnel syndrome, bilateral upper limbs: Secondary | ICD-10-CM | POA: Diagnosis not present

## 2020-06-29 DIAGNOSIS — Z23 Encounter for immunization: Secondary | ICD-10-CM | POA: Insufficient documentation

## 2020-06-29 DIAGNOSIS — R350 Frequency of micturition: Secondary | ICD-10-CM

## 2020-06-29 DIAGNOSIS — Z0000 Encounter for general adult medical examination without abnormal findings: Secondary | ICD-10-CM | POA: Diagnosis not present

## 2020-06-29 DIAGNOSIS — E782 Mixed hyperlipidemia: Secondary | ICD-10-CM | POA: Diagnosis not present

## 2020-06-29 DIAGNOSIS — K219 Gastro-esophageal reflux disease without esophagitis: Secondary | ICD-10-CM | POA: Diagnosis not present

## 2020-06-29 LAB — POCT URINALYSIS DIPSTICK
Bilirubin, UA: NEGATIVE
Blood, UA: NEGATIVE
Glucose, UA: NEGATIVE
Ketones, UA: NEGATIVE
Leukocytes, UA: NEGATIVE
Nitrite, UA: POSITIVE
Protein, UA: NEGATIVE
Spec Grav, UA: 1.01 (ref 1.010–1.025)
Urobilinogen, UA: 0.2 E.U./dL
pH, UA: 6.5 (ref 5.0–8.0)

## 2020-06-29 NOTE — Assessment & Plan Note (Signed)
Patient's GERD is under control with diet.

## 2020-06-29 NOTE — Assessment & Plan Note (Signed)
-   The patient's hyperlipidemia is stable on lipitor. - The patient will continue the current treatment regimen.  - I encouraged the patient to eat more vegetables and whole wheat, and to avoid fatty foods like whole milk, hard cheese, egg yolks, margarine, baked sweets, and fried foods.  - I encouraged the patient to live an active lifestyle and complete activities for 40 minutes at least three times per week.  - I instructed the patient to go to the ER if they begin having chest pain.   

## 2020-06-29 NOTE — Patient Instructions (Signed)
Dietary Guidelines to Help Prevent Kidney Stones Kidney stones are deposits of minerals and salts that form inside your kidneys. Your risk of developing kidney stones may be greater depending on your diet, your lifestyle, the medicines you take, and whether you have certain medical conditions. Most people can reduce their chances of developing kidney stones by following the instructions below. Depending on your overall health and the type of kidney stones you tend to develop, your dietitian may give you more specific instructions. What are tips for following this plan? Reading food labels 1. Choose foods with "no salt added" or "low-salt" labels. Limit your sodium intake to less than 1500 mg per day. 2. Choose foods with calcium for each meal and snack. Try to eat about 300 mg of calcium at each meal. Foods that contain 200-500 mg of calcium per serving include: ? 8 oz (237 ml) of milk, fortified nondairy milk, and fortified fruit juice. ? 8 oz (237 ml) of kefir, yogurt, and soy yogurt. ? 4 oz (118 ml) of tofu. ? 1 oz of cheese. ? 1 cup (300 g) of dried figs. ? 1 cup (91 g) of cooked broccoli. ? 1-3 oz can of sardines or mackerel. 3. Most people need 1000 to 1500 mg of calcium each day. Talk to your dietitian about how much calcium is recommended for you. Shopping 1. Buy plenty of fresh fruits and vegetables. Most people do not need to avoid fruits and vegetables, even if they contain nutrients that may contribute to kidney stones. 2. When shopping for convenience foods, choose: ? Whole pieces of fruit. ? Premade salads with dressing on the side. ? Low-fat fruit and yogurt smoothies. 3. Avoid buying frozen meals or prepared deli foods. 4. Look for foods with live cultures, such as yogurt and kefir. Cooking  Do not add salt to food when cooking. Place a salt shaker on the table and allow each person to add his or her own salt to taste.  Use vegetable protein, such as beans, textured  vegetable protein (TVP), or tofu instead of meat in pasta, casseroles, and soups. Meal planning  1. Eat less salt, if told by your dietitian. To do this: ? Avoid eating processed or premade food. ? Avoid eating fast food. 2. Eat less animal protein, including cheese, meat, poultry, or fish, if told by your dietitian. To do this: ? Limit the number of times you have meat, poultry, fish, or cheese each week. Eat a diet free of meat at least 2 days a week. ? Eat only one serving each day of meat, poultry, fish, or seafood. ? When you prepare animal protein, cut pieces into small portion sizes. For most meat and fish, one serving is about the size of one deck of cards. 3. Eat at least 5 servings of fresh fruits and vegetables each day. To do this: ? Keep fruits and vegetables on hand for snacks. ? Eat 1 piece of fruit or a handful of berries with breakfast. ? Have a salad and fruit at lunch. ? Have two kinds of vegetables at dinner. 4. Limit foods that are high in a substance called oxalate. These include: ? Spinach. ? Rhubarb. ? Beets. ? Potato chips and french fries. ? Nuts. 5. If you regularly take a diuretic medicine, make sure to eat at least 1-2 fruits or vegetables high in potassium each day. These include: ? Avocado. ? Banana. ? Orange, prune, carrot, or tomato juice. ? Baked potato. ? Cabbage. ? Beans and split  peas. General instructions  1. Drink enough fluid to keep your urine clear or pale yellow. This is the most important thing you can do. 2. Talk to your health care provider and dietitian about taking daily supplements. Depending on your health and the cause of your kidney stones, you may be advised: ? Not to take supplements with vitamin C. ? To take a calcium supplement. ? To take a daily probiotic supplement. ? To take other supplements such as magnesium, fish oil, or vitamin B6. 3. Take all medicines and supplements as told by your health care provider. 4. Limit  alcohol intake to no more than 1 drink a day for nonpregnant women and 2 drinks a day for men. One drink equals 12 oz of beer, 5 oz of wine, or 1 oz of hard liquor. 5. Lose weight if told by your health care provider. Work with your dietitian to find strategies and an eating plan that works best for you. What foods are not recommended? Limit your intake of the following foods, or as told by your dietitian. Talk to your dietitian about specific foods you should avoid based on the type of kidney stones and your overall health. Grains Breads. Bagels. Rolls. Baked goods. Salted crackers. Cereal. Pasta. Vegetables Spinach. Rhubarb. Beets. Canned vegetables. Jessica Browning. Olives. Meats and other protein foods Nuts. Nut butters. Large portions of meat, poultry, or fish. Salted or cured meats. Deli meats. Hot dogs. Sausages. Dairy Cheese. Beverages Regular soft drinks. Regular vegetable juice. Seasonings and other foods Seasoning blends with salt. Salad dressings. Canned soups. Soy sauce. Ketchup. Barbecue sauce. Canned pasta sauce. Casseroles. Pizza. Lasagna. Frozen meals. Potato chips. [place] fries. Summary  You can reduce your risk of kidney stones by making changes to your diet.  The most important thing you can do is drink enough fluid. You should drink enough fluid to keep your urine clear or pale yellow.  Ask your health care provider or dietitian how much protein from animal sources you should eat each day, and also how much salt and calcium you should have each day. This information is not intended to replace advice given to you by your health care provider. Make sure you discuss any questions you have with your health care provider. Document Revised: 01/27/2019 Document Reviewed: 09/17/2016 Elsevier Patient Education  2020 [employer].

## 2020-06-29 NOTE — Progress Notes (Signed)
Established Patient Office Visit  SUBJECTIVE:  Subjective  Patient ID: Jessica Browning, female    DOB: 1934-04-04  Age: 86 y.o. MRN: [id number]  CC:  Chief Complaint  Patient presents with  . Annual Exam    HPI Jessica Browning is a 86 y.o. female presenting today for an annual exam.   She notes that she passed a kidney stone yesterday. She drank some lemon juice to help get rid of it.   She has been taking her medications as directed. She declines any difficulty or missed doses. Her blood pressure today is 129/85.  She is vaccinated against COVID19.Marland Kitchen She would like to get her flu shot today.    Past Medical History:  Diagnosis Date  . Basal cell carcinoma 06/18/2016   Posterior neck. Nodular pattern.  . Hypertension     Past Surgical History:  Procedure Laterality Date  . ABDOMINAL HYSTERECTOMY    . BLADDER SURGERY    . VEIN LIGATION AND STRIPPING     bilateral legs    History reviewed. No pertinent family history.  Social History   Socioeconomic History  . Marital status: Single    Spouse name: Not on file  . Number of children: Not on file  . Years of education: Not on file  . Highest education level: Not on file  Occupational History  . Not on file  Tobacco Use  . Smoking status: Never Smoker  . Smokeless tobacco: Never Used  Substance and Sexual Activity  . Alcohol use: Never  . Drug use: Never  . Sexual activity: Not Currently  Other Topics Concern  . Not on file  Social History Narrative  . Not on file   Social Determinants of Health   Financial Resource Strain:   . Difficulty of Paying Living Expenses: Not on file  Food Insecurity:   . Worried About Charity fundraiser in the Last Year: Not on file  . Ran Out of Food in the Last Year: Not on file  Transportation Needs:   . Lack of Transportation (Medical): Not on file  . Lack of Transportation (Non-Medical): Not on file  Physical Activity:   . Days of Exercise per Week: Not on  file  . Minutes of Exercise per Session: Not on file  Stress:   . Feeling of Stress : Not on file  Social Connections:   . Frequency of Communication with Friends and Family: Not on file  . Frequency of Social Gatherings with Friends and Family: Not on file  . Attends Religious Services: Not on file  . Active Member of Clubs or Organizations: Not on file  . Attends Archivist Meetings: Not on file  . Marital Status: Not on file  Intimate Partner Violence:   . Fear of Current or Ex-Partner: Not on file  . Emotionally Abused: Not on file  . Physically Abused: Not on file  . Sexually Abused: Not on file     Current Outpatient Medications:  .  ALPRAZolam (XANAX) 0.25 MG tablet, Take 1 tablet (0.25 mg total) by mouth 2 (two) times daily., Disp: 60 tablet, Rfl: 1 .  amLODipine (NORVASC) 5 MG tablet, TAKE 1 TABLET BY MOUTH EVERY DAY, Disp: 90 tablet, Rfl: 2 .  aspirin EC 81 MG tablet, Take 81 mg by mouth daily. Swallow whole., Disp: , Rfl:  .  atorvastatin (LIPITOR) 10 MG tablet, Take 10 mg by mouth daily., Disp: , Rfl:  .  CALCIUM 1000 + D 1000-800  MG-UNIT TABS, Take 1 tablet by mouth daily., Disp: 30 tablet, Rfl: 6 .  EPINEPHrine (EPIPEN 2-PAK) 0.3 mg/0.3 mL IJ SOAJ injection, Inject 0.3 mg into the muscle as needed for anaphylaxis., Disp: , Rfl:  .  nitrofurantoin (MACRODANTIN) 100 MG capsule, TAKE 1 CAPSULE BY MOUTH TWICE A DAY FOR 7 DAYS, Disp: 14 capsule, Rfl: 1   Allergies  Allergen Reactions  . Doxycycline Nausea And Vomiting  . Tetanus Toxoids Hives    ROS Review of Systems  Constitutional: Positive for chills. Negative for fever.  HENT: Negative.   Eyes: Negative.   Respiratory: Negative.   Cardiovascular: Negative.   Gastrointestinal: Negative.   Endocrine: Negative.   Genitourinary: Negative.  Negative for decreased urine volume, dysuria, frequency, hematuria and urgency.       Passed kidney stone  Musculoskeletal: Negative.   Skin: Negative.     Allergic/Immunologic: Negative.   Neurological: Negative.   Hematological: Negative.   Psychiatric/Behavioral: Negative.   All other systems reviewed and are negative.    OBJECTIVE:    Physical Exam Vitals reviewed.  Constitutional:      Appearance: Normal appearance.  HENT:     Mouth/Throat:     Mouth: Mucous membranes are moist.  Eyes:     Pupils: Pupils are equal, round, and reactive to light.  Neck:     Vascular: No carotid bruit.  Cardiovascular:     Rate and Rhythm: Normal rate and regular rhythm.     Pulses: Normal pulses.     Heart sounds: Normal heart sounds.  Pulmonary:     Effort: Pulmonary effort is normal.     Breath sounds: Normal breath sounds.  Abdominal:     General: Bowel sounds are normal.     Palpations: Abdomen is soft. There is no hepatomegaly, splenomegaly or mass.     Tenderness: There is no abdominal tenderness.     Hernia: No hernia is present.  Musculoskeletal:        General: No tenderness.     Cervical back: Neck supple.     Right lower leg: No edema.     Left lower leg: No edema.  Skin:    Findings: No rash.     Comments: 2x2 cm cyst at the center of the back  Neurological:     Mental Status: She is alert and oriented to person, place, and time.     Motor: No weakness.  Psychiatric:        Mood and Affect: Mood and affect normal.        Behavior: Behavior normal.     BP 129/85   Pulse 76   Ht 5' 5"  (1.651 m)   Wt 129 lb (58.5 kg)   BMI 21.47 kg/m  Wt Readings from Last 3 Encounters:  06/29/20 129 lb (58.5 kg)  05/23/20 138 lb (62.6 kg)  03/23/20 139 lb 3.2 oz (63.1 kg)    Health Maintenance Due  Topic Date Due  . COVID-19 Vaccine (1) Never done  . TETANUS/TDAP  Never done  . DEXA SCAN  Never done  . PNA vac Low Risk Adult (1 of 2 - PCV13) Never done    There are no preventive care reminders to display for this patient.  CBC Latest Ref Rng & Units 06/08/2020 07/20/2011 06/28/2010  WBC 3.8 - 10.8 Thousand/uL 5.2 8.9 9.9   Hemoglobin 11.7 - 15.5 g/dL 12.6 13.9 13.6  Hematocrit 35 - 45 % 38.9 42.7 41.1  Platelets 140 - 400 Thousand/uL  237 225 178   CMP Latest Ref Rng & Units 06/08/2020 07/20/2011 06/28/2010  Glucose 65 - 99 mg/dL 100(H) 167(H) 167(H)  BUN 7 - 25 mg/dL 9 25(H) 17  Creatinine 0.60 - 0.88 mg/dL 0.92(H) 1.20(H) 1.26(H)  Sodium 135 - 146 mmol/L 138 140 140  Potassium 3.5 - 5.3 mmol/L 4.5 3.0(L) 2.9(L)  Chloride 98 - 110 mmol/L 101 99 98  CO2 20 - 32 mmol/L 27 32 33(H)  Calcium 8.6 - 10.4 mg/dL 9.9 9.8 9.2  Total Protein 6.1 - 8.1 g/dL 6.5 - -  Total Bilirubin 0.2 - 1.2 mg/dL 0.6 - -  AST 10 - 35 U/L 18 - -  ALT 6 - 29 U/L 9 - -    Lab Results  Component Value Date   TSH 4.97 (H) 06/08/2020   No results found for: ALBUMIN, ANIONGAP, EGFR, GFR Lab Results  Component Value Date   CHOL 149 06/08/2020   HDL 57 06/08/2020   LDLCALC 76 06/08/2020   CHOLHDL 2.6 06/08/2020   Lab Results  Component Value Date   TRIG 80 06/08/2020   No results found for: HGBA1C  Urinalysis    Component Value Date/Time   BILIRUBINUR neg 06/29/2020 0933   PROTEINUR Negative 06/29/2020 0933   UROBILINOGEN 0.2 06/29/2020 0933   NITRITE pos 06/29/2020 0933   LEUKOCYTESUR Negative 06/29/2020 0933      ASSESSMENT & PLAN:   Problem List Items Addressed This Visit      Digestive   Gastroesophageal reflux disease without esophagitis    Patient's GERD is under control with diet.         Nervous and Auditory   Bilateral carpal tunnel syndrome    Patient denies any numbness or tingling of the hand on today's visit        Other   Frequency of urination - Primary    No evidence of UTI      Relevant Orders   POCT urinalysis dipstick (Completed)   Need for influenza vaccination    Patient received her flu vaccine      Relevant Orders   Flu Vaccine QUAD High Dose(Fluad) (Completed)   Mixed hyperlipidemia    - The patient's hyperlipidemia is stable on lipitor. - The patient will continue the  current treatment regimen.  - I encouraged the patient to eat more vegetables and whole wheat, and to avoid fatty foods like whole milk, hard cheese, egg yolks, margarine, baked sweets, and fried foods.  - I encouraged the patient to live an active lifestyle and complete activities for 40 minutes at least three times per week.  - I instructed the patient to go to the ER if they begin having chest pain.           No orders of the defined types were placed in this encounter.   EKG: normal sinus rhythm, nonspecific ST and T waves changes.   Follow-up: No follow-ups on file.    Cletis Athens, MD [facility] [address] [place], [place] [address]   By signing my name below, I, [employer], attest that this documentation has been prepared under the direction and in the presence of Dr. Cletis Athens.  Electronically Signed: Cletis Athens, MD 06/29/20, 5:31 PM   I personally performed the services described in this documentation, which was SCRIBED in my presence. The recorded information has been reviewed and considered accurate. It has been edited as necessary during review. Cletis Athens, MD

## 2020-06-29 NOTE — Assessment & Plan Note (Signed)
Patient received her flu vaccine

## 2020-06-29 NOTE — Assessment & Plan Note (Signed)
Patient denies any numbness or tingling of the hand on today's visit

## 2020-06-29 NOTE — Assessment & Plan Note (Signed)
No evidence of UTI

## 2020-07-06 ENCOUNTER — Other Ambulatory Visit: Payer: Self-pay | Admitting: Internal Medicine

## 2020-07-11 ENCOUNTER — Other Ambulatory Visit: Payer: Self-pay | Admitting: *Deleted

## 2020-07-11 DIAGNOSIS — F419 Anxiety disorder, unspecified: Secondary | ICD-10-CM

## 2020-07-11 MED ORDER — ALPRAZOLAM 0.25 MG PO TABS: 0.2500 mg | ORAL_TABLET | Freq: Two times a day (BID) | ORAL | 1 refills | Status: DC

## 2020-07-12 ENCOUNTER — Other Ambulatory Visit: Payer: Self-pay | Admitting: Internal Medicine

## 2020-07-12 DIAGNOSIS — F419 Anxiety disorder, unspecified: Secondary | ICD-10-CM

## 2020-07-17 ENCOUNTER — Telehealth: Payer: Self-pay

## 2020-07-17 NOTE — Telephone Encounter (Signed)
Ok thank you.  She should also remind [place] the day of surgery.  Our electrocautery units are safe with pacemakers, but we will try to minimize its use.  Much of the time, we don't need to use it at all.

## 2020-07-17 NOTE — Telephone Encounter (Signed)
Pt received her pre op instructions and called to inform us that she has a pacemaker.

## 2020-07-18 DIAGNOSIS — J3089 Other allergic rhinitis: Secondary | ICD-10-CM | POA: Diagnosis not present

## 2020-07-18 DIAGNOSIS — J3081 Allergic rhinitis due to animal (cat) (dog) hair and dander: Secondary | ICD-10-CM | POA: Diagnosis not present

## 2020-07-18 DIAGNOSIS — Z91018 Allergy to other foods: Secondary | ICD-10-CM | POA: Diagnosis not present

## 2020-07-24 ENCOUNTER — Other Ambulatory Visit: Payer: Self-pay

## 2020-07-24 ENCOUNTER — Ambulatory Visit (INDEPENDENT_AMBULATORY_CARE_PROVIDER_SITE_OTHER): Payer: Medicare Other | Admitting: Dermatology

## 2020-07-24 DIAGNOSIS — D485 Neoplasm of uncertain behavior of skin: Secondary | ICD-10-CM | POA: Diagnosis not present

## 2020-07-24 DIAGNOSIS — L720 Epidermal cyst: Secondary | ICD-10-CM | POA: Diagnosis not present

## 2020-07-24 NOTE — Patient Instructions (Signed)

## 2020-07-24 NOTE — Progress Notes (Signed)
   Follow-Up Visit   Subjective  Jessica Browning is a 86 y.o. female who presents for the following: Cyst (Spinal mid lower back. Surgery today).   The following portions of the chart were reviewed this encounter and updated as appropriate:      Review of Systems:  No other skin or systemic complaints except as noted in HPI or Assessment and Plan.  Objective  Well appearing patient in no apparent distress; mood and affect are within normal limits.  A focused examination was performed including back. Relevant physical exam findings are noted in the Assessment and Plan.  Objective  Spinal mid lower back: 2.2 x 1.9 cm pink white firm subcutaneous nodule with punctum   Assessment & Plan  Neoplasm of uncertain behavior of skin Spinal mid lower back  Skin excision  Lesion length (cm):  2.2 Lesion width (cm):  1.9 Total excision diameter (cm):  2.2 Informed consent: discussed and consent obtained   Timeout: patient name, date of birth, surgical site, and procedure verified   Procedure prep:  Patient was prepped and draped in usual sterile fashion Prep type:  Povidone-iodine Anesthesia: the lesion was anesthetized in a standard fashion   Anesthetic:  1% lidocaine w/ epinephrine 1-100,000 buffered w/ 8.4% NaHCO3 (11cc) Instrument used: #15 blade   Hemostasis achieved with: pressure and electrodesiccation   Outcome: patient tolerated procedure well with no complications    Skin repair Complexity:  Intermediate Final length (cm):  2 Informed consent: discussed and consent obtained   Reason for type of repair: reduce tension to allow closure, reduce the risk of dehiscence, infection, and necrosis and reduce subcutaneous dead space and avoid a hematoma   Undermining: edges undermined   Subcutaneous layers (deep stitches):  Suture size:  3-0 Suture type: Vicryl (polyglactin 910)   Stitches:  Buried vertical mattress Fine/surface layer approximation (top stitches):  Suture  size:  3-0 Suture type: nylon   Stitches: simple interrupted   Suture removal (days):  7 Hemostasis achieved with: suture Outcome: patient tolerated procedure well with no complications   Post-procedure details: sterile dressing applied and wound care instructions given   Dressing type: pressure dressing (mupirocin)    Specimen 1 - Surgical pathology Differential Diagnosis: Cyst vs other Check Margins: Yes 2.2 x 1.9 cm pink white firm subcutaneous nodule with punctum  Return in about 1 week (around 07/31/2020) for SR.   I, Jamesetta Orleans, CMA, am acting as scribe for Brendolyn Patty, MD .  Documentation: I have reviewed the above documentation for accuracy and completeness, and I agree with the above.  Brendolyn Patty MD

## 2020-07-25 ENCOUNTER — Telehealth: Payer: Self-pay

## 2020-07-25 ENCOUNTER — Other Ambulatory Visit: Payer: Self-pay | Admitting: Internal Medicine

## 2020-07-25 NOTE — Telephone Encounter (Signed)
Talked to patient and she is doing fine from her surgery yesterday. She will keep her follow-up appt.

## 2020-07-31 ENCOUNTER — Other Ambulatory Visit: Payer: Self-pay

## 2020-07-31 ENCOUNTER — Ambulatory Visit: Payer: Medicare Other

## 2020-07-31 DIAGNOSIS — L720 Epidermal cyst: Secondary | ICD-10-CM

## 2020-07-31 NOTE — Progress Notes (Signed)
   Follow-Up Visit   Subjective  Jessica Browning is a 86 y.o. female who presents for the following: Cyst bx proven (spinal mid lower back, 1 wk post op, pt presents for suture removal).   The following portions of the chart were reviewed this encounter and updated as appropriate:      Review of Systems:  No other skin or systemic complaints except as noted in HPI or Assessment and Plan.  Objective  Well appearing patient in no apparent distress; mood and affect are within normal limits.  A focused examination was performed including face, neck, chest and back and back. Relevant physical exam findings are noted in the Assessment and Plan.  Objective  spinal mid lower back: Healing excision site   Assessment & Plan  Epidermal cyst spinal mid lower back  Bx proven  Healing excision site.  Wound cleansed, sutures removed, wound cleansed and steri strips applied. Discussed pathology results.   Return if symptoms worsen or fail to improve.  I, Lyon Dumont, RMA, am acting as Education administrator for [employer], CMA .

## 2020-08-17 DIAGNOSIS — N952 Postmenopausal atrophic vaginitis: Secondary | ICD-10-CM | POA: Diagnosis not present

## 2020-08-17 DIAGNOSIS — R102 Pelvic and perineal pain: Secondary | ICD-10-CM | POA: Diagnosis not present

## 2020-08-17 DIAGNOSIS — N390 Urinary tract infection, site not specified: Secondary | ICD-10-CM | POA: Diagnosis not present

## 2020-08-17 DIAGNOSIS — R319 Hematuria, unspecified: Secondary | ICD-10-CM | POA: Diagnosis not present

## 2020-08-18 ENCOUNTER — Other Ambulatory Visit: Payer: Self-pay | Admitting: *Deleted

## 2020-08-18 DIAGNOSIS — R350 Frequency of micturition: Secondary | ICD-10-CM

## 2020-08-18 MED ORDER — NITROFURANTOIN MACROCRYSTAL 100 MG PO CAPS: ORAL_CAPSULE | ORAL | 1 refills | Status: DC

## 2020-08-21 DIAGNOSIS — Z23 Encounter for immunization: Secondary | ICD-10-CM | POA: Diagnosis not present

## 2020-08-24 ENCOUNTER — Ambulatory Visit (INDEPENDENT_AMBULATORY_CARE_PROVIDER_SITE_OTHER): Payer: Medicare Other | Admitting: Internal Medicine

## 2020-08-24 ENCOUNTER — Other Ambulatory Visit: Payer: Self-pay

## 2020-08-24 VITALS — BP 146/84 | HR 74

## 2020-08-24 DIAGNOSIS — Z950 Presence of cardiac pacemaker: Secondary | ICD-10-CM | POA: Diagnosis not present

## 2020-08-24 DIAGNOSIS — E782 Mixed hyperlipidemia: Secondary | ICD-10-CM

## 2020-08-24 DIAGNOSIS — I10 Essential (primary) hypertension: Secondary | ICD-10-CM

## 2020-08-24 DIAGNOSIS — F419 Anxiety disorder, unspecified: Secondary | ICD-10-CM

## 2020-08-27 NOTE — Assessment & Plan Note (Signed)
Pacemaker is working well.  Battery is good for more than 3 months.  Impedance is 593 on the right ventricular side.  Estimated remaining longevity of pacemaker is 26 months

## 2020-08-27 NOTE — Progress Notes (Signed)
Established Patient Office Visit  Subjective:  Patient ID: Jessica Browning, female    DOB: January 17, 1934  Age: 86 y.o. MRN: [id number]  CC: No chief complaint on file.   HPI  JONELL BRUMBAUGH presents for pacer pacer check and office visit.  Past Medical History:  Diagnosis Date  . Basal cell carcinoma 06/18/2016   Posterior neck. Nodular pattern.  . Hypertension     Past Surgical History:  Procedure Laterality Date  . ABDOMINAL HYSTERECTOMY    . BLADDER SURGERY    . VEIN LIGATION AND STRIPPING     bilateral legs    No family history on file.  Social History   Socioeconomic History  . Marital status: Single    Spouse name: Not on file  . Number of children: Not on file  . Years of education: Not on file  . Highest education level: Not on file  Occupational History  . Not on file  Tobacco Use  . Smoking status: Never Smoker  . Smokeless tobacco: Never Used  Substance and Sexual Activity  . Alcohol use: Never  . Drug use: Never  . Sexual activity: Not Currently  Other Topics Concern  . Not on file  Social History Narrative  . Not on file   Social Determinants of Health   Financial Resource Strain:   . Difficulty of Paying Living Expenses: Not on file  Food Insecurity:   . Worried About Charity fundraiser in the Last Year: Not on file  . Ran Out of Food in the Last Year: Not on file  Transportation Needs:   . Lack of Transportation (Medical): Not on file  . Lack of Transportation (Non-Medical): Not on file  Physical Activity:   . Days of Exercise per Week: Not on file  . Minutes of Exercise per Session: Not on file  Stress:   . Feeling of Stress : Not on file  Social Connections:   . Frequency of Communication with Friends and Family: Not on file  . Frequency of Social Gatherings with Friends and Family: Not on file  . Attends Religious Services: Not on file  . Active Member of Clubs or Organizations: Not on file  . Attends Archivist  Meetings: Not on file  . Marital Status: Not on file  Intimate Partner Violence:   . Fear of Current or Ex-Partner: Not on file  . Emotionally Abused: Not on file  . Physically Abused: Not on file  . Sexually Abused: Not on file     Current Outpatient Medications:  .  ALPRAZolam (XANAX) 0.25 MG tablet, TAKE 1 TABLET (0.25 MG TOTAL) BY MOUTH 2 (TWO) TIMES DAILY., Disp: 60 tablet, Rfl: 1 .  amLODipine (NORVASC) 5 MG tablet, TAKE 1 TABLET BY MOUTH EVERY DAY, Disp: 90 tablet, Rfl: 2 .  aspirin EC 81 MG tablet, Take 81 mg by mouth daily. Swallow whole., Disp: , Rfl:  .  atorvastatin (LIPITOR) 10 MG tablet, TAKE 1 TABLET BY MOUTH EVERY DAY, Disp: 90 tablet, Rfl: 3 .  CALCIUM 1000 + D 1000-800 MG-UNIT TABS, Take 1 tablet by mouth daily., Disp: 30 tablet, Rfl: 6 .  EPINEPHrine (EPIPEN 2-PAK) 0.3 mg/0.3 mL IJ SOAJ injection, Inject 0.3 mg into the muscle as needed for anaphylaxis., Disp: , Rfl:  .  nitrofurantoin (MACRODANTIN) 100 MG capsule, TAKE 1 CAPSULE BY MOUTH TWICE A DAY FOR 7 DAYS, Disp: 14 capsule, Rfl: 1   Allergies  Allergen Reactions  . Doxycycline Nausea And  Vomiting  . Tetanus Toxoids Hives    ROS Review of Systems  Constitutional: Negative.   HENT: Negative.   Eyes: Negative.   Respiratory: Negative.   Cardiovascular: Negative.   Gastrointestinal: Negative.   Endocrine: Negative.   Genitourinary: Negative.   Musculoskeletal: Negative.   Skin: Negative.   Allergic/Immunologic: Negative.   Neurological: Negative.   Hematological: Negative.   Psychiatric/Behavioral: The patient is nervous/anxious.   All other systems reviewed and are negative.     Objective:    Physical Exam Vitals reviewed.  Constitutional:      Appearance: Normal appearance.  HENT:     Mouth/Throat:     Mouth: Mucous membranes are moist.  Eyes:     Pupils: Pupils are equal, round, and reactive to light.  Neck:     Vascular: No carotid bruit.  Cardiovascular:     Rate and Rhythm: Normal  rate and regular rhythm.     Pulses: Normal pulses.     Heart sounds: Normal heart sounds.  Pulmonary:     Effort: Pulmonary effort is normal.     Breath sounds: Normal breath sounds.  Abdominal:     General: Bowel sounds are normal.     Palpations: Abdomen is soft. There is no hepatomegaly, splenomegaly or mass.     Tenderness: There is no abdominal tenderness.     Hernia: No hernia is present.  Musculoskeletal:        General: No tenderness.     Cervical back: Neck supple.     Right lower leg: No edema.     Left lower leg: No edema.  Skin:    Findings: No rash.  Neurological:     Mental Status: She is alert and oriented to person, place, and time.     Motor: No weakness.  Psychiatric:        Mood and Affect: Mood and affect normal.        Behavior: Behavior normal.     BP (!) 146/84   Pulse 74  Wt Readings from Last 3 Encounters:  06/29/20 129 lb (58.5 kg)  05/23/20 138 lb (62.6 kg)  03/23/20 139 lb 3.2 oz (63.1 kg)     Health Maintenance Due  Topic Date Due  . COVID-19 Vaccine (1) Never done  . TETANUS/TDAP  Never done  . DEXA SCAN  Never done  . PNA vac Low Risk Adult (1 of 2 - PCV13) Never done    There are no preventive care reminders to display for this patient.  Lab Results  Component Value Date   TSH 4.97 (H) 06/08/2020   Lab Results  Component Value Date   WBC 5.2 06/08/2020   HGB 12.6 06/08/2020   HCT 38.9 06/08/2020   MCV 89.6 06/08/2020   PLT 237 06/08/2020   Lab Results  Component Value Date   NA 138 06/08/2020   K 4.5 06/08/2020   CO2 27 06/08/2020   GLUCOSE 100 (H) 06/08/2020   BUN 9 06/08/2020   CREATININE 0.92 (H) 06/08/2020   BILITOT 0.6 06/08/2020   AST 18 06/08/2020   ALT 9 06/08/2020   PROT 6.5 06/08/2020   CALCIUM 9.9 06/08/2020   Lab Results  Component Value Date   CHOL 149 06/08/2020   Lab Results  Component Value Date   HDL 57 06/08/2020   Lab Results  Component Value Date   LDLCALC 76 06/08/2020   Lab  Results  Component Value Date   TRIG 80 06/08/2020   Lab  Results  Component Value Date   CHOLHDL 2.6 06/08/2020   No results found for: HGBA1C    Assessment & Plan:   Problem List Items Addressed This Visit      Other   Cardiac pacemaker in situ - Primary    Pacemaker is working well.  Battery is good for more than 3 months.  Impedance is 593 on the right ventricular side.  Estimated remaining longevity of pacemaker is 26 months      Relevant Orders   PACEMAKER IN CLINIC CHECK (Completed)   Anxiety    Patient is very anxious and she takes Xanax 25 mg twice a day.      Mixed hyperlipidemia    Patient was advised to follow low-cholesterol diet and walk daily.  She does not want to take any statin.       Other Visit Diagnoses    Essential hypertension          Patient pacemaker was interrogated by pacemakers analyzer, battery status is okay. No programming changes were indicated after the review of the data. Histogram shows no change since the last interrogation  Atrial and ventricular sensing thresholds were found to be acceptable  Impedance was checked and it was found to be normal. Thresholds were found to be okay on evaluation of rhythm problem. No high rate or low rate arrhythmia were noted. Estimated battery longevity is 26 months. I have personally reviewed the device data and amended the report as necessary.  No orders of the defined types were placed in this encounter.   Follow-up: No follow-ups on file.    Cletis Athens, MD

## 2020-08-27 NOTE — Assessment & Plan Note (Signed)
Patient was advised to follow low-cholesterol diet and walk daily.  She does not want to take any statin.

## 2020-08-27 NOTE — Assessment & Plan Note (Signed)
Patient is very anxious and she takes Xanax 25 mg twice a day.

## 2020-10-17 ENCOUNTER — Other Ambulatory Visit: Payer: Self-pay | Admitting: Internal Medicine

## 2020-10-17 DIAGNOSIS — R350 Frequency of micturition: Secondary | ICD-10-CM

## 2020-11-27 ENCOUNTER — Other Ambulatory Visit: Payer: Self-pay

## 2020-11-27 ENCOUNTER — Ambulatory Visit (INDEPENDENT_AMBULATORY_CARE_PROVIDER_SITE_OTHER): Payer: Medicare Other | Admitting: Internal Medicine

## 2020-11-27 VITALS — BP 137/78 | HR 68

## 2020-11-27 DIAGNOSIS — F419 Anxiety disorder, unspecified: Secondary | ICD-10-CM

## 2020-11-27 DIAGNOSIS — I480 Paroxysmal atrial fibrillation: Secondary | ICD-10-CM | POA: Diagnosis not present

## 2020-11-27 DIAGNOSIS — G5603 Carpal tunnel syndrome, bilateral upper limbs: Secondary | ICD-10-CM

## 2020-11-27 DIAGNOSIS — Z950 Presence of cardiac pacemaker: Secondary | ICD-10-CM

## 2020-11-27 DIAGNOSIS — R0602 Shortness of breath: Secondary | ICD-10-CM

## 2020-12-18 ENCOUNTER — Other Ambulatory Visit: Payer: Self-pay | Admitting: *Deleted

## 2020-12-18 DIAGNOSIS — F419 Anxiety disorder, unspecified: Secondary | ICD-10-CM

## 2020-12-18 MED ORDER — ALPRAZOLAM 0.25 MG PO TABS: 0.2500 mg | ORAL_TABLET | Freq: Two times a day (BID) | ORAL | 1 refills | Status: DC

## 2020-12-18 NOTE — Assessment & Plan Note (Signed)
Patient is on chronic anticoagulation

## 2020-12-18 NOTE — Assessment & Plan Note (Signed)
Patient was advised to walk on a daily basis

## 2020-12-18 NOTE — Assessment & Plan Note (Signed)
Patient will be referred to orthopedic surgeon

## 2020-12-18 NOTE — Progress Notes (Signed)
Established Patient Office Visit  Subjective:  Patient ID: Jessica Browning, female    DOB: Jul 19, 1934  Age: 87 y.o. MRN: [id number]  CC: No chief complaint on file.   HPI  Jessica Browning presents for pacer check and office visit Patient is known to have atrial fibrillation bilateral carpal tunnel syndrome anxiety neurosis and shortness of breath.  He came today for the pacemaker check.  Past Medical History:  Diagnosis Date  . Basal cell carcinoma 06/18/2016   Posterior neck. Nodular pattern.  . Hypertension     Past Surgical History:  Procedure Laterality Date  . ABDOMINAL HYSTERECTOMY    . BLADDER SURGERY    . VEIN LIGATION AND STRIPPING     bilateral legs    History reviewed. No pertinent family history.  Social History   Socioeconomic History  . Marital status: Single    Spouse name: Not on file  . Number of children: Not on file  . Years of education: Not on file  . Highest education level: Not on file  Occupational History  . Not on file  Tobacco Use  . Smoking status: Never Smoker  . Smokeless tobacco: Never Used  Substance and Sexual Activity  . Alcohol use: Never  . Drug use: Never  . Sexual activity: Not Currently  Other Topics Concern  . Not on file  Social History Narrative  . Not on file   Social Determinants of Health   Financial Resource Strain: Not on file  Food Insecurity: Not on file  Transportation Needs: Not on file  Physical Activity: Not on file  Stress: Not on file  Social Connections: Not on file  Intimate Partner Violence: Not on file     Current Outpatient Medications:  .  ALPRAZolam (XANAX) 0.25 MG tablet, Take 1 tablet (0.25 mg total) by mouth 2 (two) times daily., Disp: 60 tablet, Rfl: 1 .  amLODipine (NORVASC) 5 MG tablet, TAKE 1 TABLET BY MOUTH EVERY DAY, Disp: 90 tablet, Rfl: 2 .  aspirin EC 81 MG tablet, Take 81 mg by mouth daily. Swallow whole., Disp: , Rfl:  .  atorvastatin (LIPITOR) 10 MG tablet, TAKE 1  TABLET BY MOUTH EVERY DAY, Disp: 90 tablet, Rfl: 3 .  CALCIUM 1000 + D 1000-800 MG-UNIT TABS, Take 1 tablet by mouth daily., Disp: 30 tablet, Rfl: 6 .  EPINEPHrine (EPIPEN 2-PAK) 0.3 mg/0.3 mL IJ SOAJ injection, Inject 0.3 mg into the muscle as needed for anaphylaxis., Disp: , Rfl:  .  nitrofurantoin (MACRODANTIN) 100 MG capsule, TAKE 1 CAPSULE BY MOUTH TWICE A DAY FOR 7 DAYS, Disp: 14 capsule, Rfl: 1   Allergies  Allergen Reactions  . Doxycycline Nausea And Vomiting  . Tetanus Toxoids Hives    ROS Review of Systems  Constitutional: Negative.   HENT: Negative.   Eyes: Negative.   Respiratory: Negative.   Cardiovascular: Negative.   Gastrointestinal: Negative.   Endocrine: Negative.   Genitourinary: Negative.   Musculoskeletal: Negative.   Skin: Negative.   Allergic/Immunologic: Negative.   Neurological: Negative.   Hematological: Negative.   Psychiatric/Behavioral: Negative.   All other systems reviewed and are negative.     Objective:    Physical Exam Vitals reviewed.  Constitutional:      Appearance: Normal appearance.  HENT:     Mouth/Throat:     Mouth: Mucous membranes are moist.  Eyes:     Pupils: Pupils are equal, round, and reactive to light.  Neck:     Vascular: No  carotid bruit.  Cardiovascular:     Rate and Rhythm: Normal rate and regular rhythm.     Pulses: Normal pulses.     Heart sounds: Normal heart sounds.  Pulmonary:     Effort: Pulmonary effort is normal.     Breath sounds: Normal breath sounds.  Abdominal:     General: Bowel sounds are normal.     Palpations: Abdomen is soft. There is no hepatomegaly, splenomegaly or mass.     Tenderness: There is no abdominal tenderness.     Hernia: No hernia is present.  Musculoskeletal:        General: No tenderness.     Cervical back: Neck supple.     Right lower leg: No edema.     Left lower leg: No edema.  Skin:    Findings: No rash.  Neurological:     Mental Status: She is alert and oriented to  person, place, and time.     Motor: No weakness.  Psychiatric:        Mood and Affect: Mood and affect normal.        Behavior: Behavior normal.     BP 137/78   Pulse 68  Wt Readings from Last 3 Encounters:  06/29/20 129 lb (58.5 kg)  05/23/20 138 lb (62.6 kg)  03/23/20 139 lb 3.2 oz (63.1 kg)     Health Maintenance Due  Topic Date Due  . TETANUS/TDAP  Never done  . DEXA SCAN  Never done  . PNA vac Low Risk Adult (2 of 2 - PCV13) 07/22/2019  . COVID-19 Vaccine (2 - Moderna risk 4-dose series) 08/15/2020    There are no preventive care reminders to display for this patient.  Lab Results  Component Value Date   TSH 4.97 (H) 06/08/2020   Lab Results  Component Value Date   WBC 5.2 06/08/2020   HGB 12.6 06/08/2020   HCT 38.9 06/08/2020   MCV 89.6 06/08/2020   PLT 237 06/08/2020   Lab Results  Component Value Date   NA 138 06/08/2020   K 4.5 06/08/2020   CO2 27 06/08/2020   GLUCOSE 100 (H) 06/08/2020   BUN 9 06/08/2020   CREATININE 0.92 (H) 06/08/2020   BILITOT 0.6 06/08/2020   AST 18 06/08/2020   ALT 9 06/08/2020   PROT 6.5 06/08/2020   CALCIUM 9.9 06/08/2020   Lab Results  Component Value Date   CHOL 149 06/08/2020   Lab Results  Component Value Date   HDL 57 06/08/2020   Lab Results  Component Value Date   LDLCALC 76 06/08/2020   Lab Results  Component Value Date   TRIG 80 06/08/2020   Lab Results  Component Value Date   CHOLHDL 2.6 06/08/2020   No results found for: HGBA1C    Assessment & Plan:   Problem List Items Addressed This Visit      Cardiovascular and Mediastinum   Paroxysmal atrial fibrillation ([place])    Patient is on chronic anticoagulation        Nervous and Auditory   Bilateral carpal tunnel syndrome    Patient will be referred to orthopedic surgeon        Other   Anxiety    - Patient experiencing high levels of anxiety.  - Encouraged patient to engage in relaxing activities like yoga, meditation, journaling,  going for a walk, or participating in a hobby.  - Encouraged patient to reach out to trusted friends or family members about recent struggles  Shortness of breath    Patient was advised to walk on a daily basis       Other Visit Diagnoses    Pacemaker    -  Primary   Relevant Orders   PACEMAKER IN CLINIC CHECK (Completed)     Note: Medical Device Follow-up  Patient pacemaker was interrogated by pacemakers analyzer, battery status is okay.  No programming changes were indicated after the review of the data.  Histogram shows no change since the last interrogation Atrial and ventricular sensing thresholds were found to be acceptable Impedance was checked and it was found to be normal.  Thresholds were found to be okay on evaluation of rhythm problem.  No high rate or low rate arrhythmia were noted.  Estimated battery longevity is 23 months. I have personally reviewed the device data and amended the report as necessary.  No orders of the defined types were placed in this encounter.   Follow-up: No follow-ups on file.    Cletis Athens, MD

## 2020-12-18 NOTE — Assessment & Plan Note (Signed)
-   Patient experiencing high levels of anxiety.  - Encouraged patient to engage in relaxing activities like yoga, meditation, journaling, going for a walk, or participating in a hobby.  - Encouraged patient to reach out to trusted friends or family members about recent struggles 

## 2020-12-19 ENCOUNTER — Other Ambulatory Visit: Payer: Self-pay | Admitting: Internal Medicine

## 2020-12-19 DIAGNOSIS — F419 Anxiety disorder, unspecified: Secondary | ICD-10-CM

## 2020-12-28 DIAGNOSIS — H524 Presbyopia: Secondary | ICD-10-CM | POA: Diagnosis not present

## 2020-12-28 DIAGNOSIS — H2513 Age-related nuclear cataract, bilateral: Secondary | ICD-10-CM | POA: Diagnosis not present

## 2021-01-22 ENCOUNTER — Ambulatory Visit (INDEPENDENT_AMBULATORY_CARE_PROVIDER_SITE_OTHER): Payer: Medicare Other | Admitting: Internal Medicine

## 2021-01-22 ENCOUNTER — Encounter: Payer: Self-pay | Admitting: Internal Medicine

## 2021-01-22 ENCOUNTER — Other Ambulatory Visit: Payer: Self-pay

## 2021-01-22 VITALS — BP 138/78 | HR 80 | Ht 62.0 in | Wt 145.0 lb

## 2021-01-22 DIAGNOSIS — R350 Frequency of micturition: Secondary | ICD-10-CM | POA: Diagnosis not present

## 2021-01-22 DIAGNOSIS — E782 Mixed hyperlipidemia: Secondary | ICD-10-CM

## 2021-01-22 DIAGNOSIS — K219 Gastro-esophageal reflux disease without esophagitis: Secondary | ICD-10-CM | POA: Diagnosis not present

## 2021-01-22 DIAGNOSIS — R0602 Shortness of breath: Secondary | ICD-10-CM | POA: Diagnosis not present

## 2021-01-22 LAB — POCT URINALYSIS DIPSTICK
Bilirubin, UA: NEGATIVE
Glucose, UA: NEGATIVE
Ketones, UA: NEGATIVE
Nitrite, UA: NEGATIVE
Protein, UA: NEGATIVE
Spec Grav, UA: <=1.005 — AB (ref 1.010–1.025)
Urobilinogen, UA: 0.2 E.U./dL
pH, UA: 6 (ref 5.0–8.0)

## 2021-01-22 MED ORDER — NITROFURANTOIN MACROCRYSTAL 100 MG PO CAPS: ORAL_CAPSULE | ORAL | 1 refills | Status: DC

## 2021-01-22 NOTE — Assessment & Plan Note (Signed)
Urine sediment is still abnormal we will continue Macrodantin for another week.

## 2021-01-22 NOTE — Assessment & Plan Note (Signed)
-   The patient's GERD is stable on medication.  - Instructed the patient to avoid eating spicy and acidic foods, as well as foods high in fat. - Instructed the patient to avoid eating large meals or meals 2-3 hours prior to sleeping. 

## 2021-01-22 NOTE — Assessment & Plan Note (Signed)
Hypercholesterolemia  I advised the patient to follow Mediterranean diet This diet is rich in fruits vegetables and whole grain, and This diet is also rich in fish and lean meat Patient should also eat a handful of almonds or walnuts daily Recent heart study indicated that average follow-up on this kind of diet reduces the cardiovascular mortality by 50 to 70%== 

## 2021-01-22 NOTE — Progress Notes (Signed)
Established Patient Office Visit  Subjective:  Patient ID: Jessica Browning, female    DOB: 06/06/1934  Age: 87 y.o. MRN: [id number]  CC:  Chief Complaint  Patient presents with  . Urinary Tract Infection    HPI  Jessica Browning presents for check up patient is known to have reflux problem and numbness of the both hands due to carpal tunnel syndrome.  She has a pacemaker which is working well.  Complain of shortness of breath on exertion.  Also is known to have mixed hyperlipidemia and she has been following low-cholesterol diet.  Past Medical History:  Diagnosis Date  . Basal cell carcinoma 06/18/2016   Posterior neck. Nodular pattern.  . Hypertension     Past Surgical History:  Procedure Laterality Date  . ABDOMINAL HYSTERECTOMY    . BLADDER SURGERY    . VEIN LIGATION AND STRIPPING     bilateral legs    History reviewed. No pertinent family history.  Social History   Socioeconomic History  . Marital status: Single    Spouse name: Not on file  . Number of children: Not on file  . Years of education: Not on file  . Highest education level: Not on file  Occupational History  . Not on file  Tobacco Use  . Smoking status: Never Smoker  . Smokeless tobacco: Never Used  Substance and Sexual Activity  . Alcohol use: Never  . Drug use: Never  . Sexual activity: Not Currently  Other Topics Concern  . Not on file  Social History Narrative  . Not on file   Social Determinants of Health   Financial Resource Strain: Not on file  Food Insecurity: Not on file  Transportation Needs: Not on file  Physical Activity: Not on file  Stress: Not on file  Social Connections: Not on file  Intimate Partner Violence: Not on file     Current Outpatient Medications:  .  ALPRAZolam (XANAX) 0.25 MG tablet, Take 1 tablet (0.25 mg total) by mouth 2 (two) times daily., Disp: 60 tablet, Rfl: 1 .  amLODipine (NORVASC) 5 MG tablet, TAKE 1 TABLET BY MOUTH EVERY DAY, Disp: 90  tablet, Rfl: 2 .  aspirin EC 81 MG tablet, Take 81 mg by mouth daily. Swallow whole., Disp: , Rfl:  .  atorvastatin (LIPITOR) 10 MG tablet, TAKE 1 TABLET BY MOUTH EVERY DAY, Disp: 90 tablet, Rfl: 3 .  CALCIUM 1000 + D 1000-800 MG-UNIT TABS, Take 1 tablet by mouth daily., Disp: 30 tablet, Rfl: 6 .  EPINEPHrine 0.3 mg/0.3 mL IJ SOAJ injection, Inject 0.3 mg into the muscle as needed for anaphylaxis., Disp: , Rfl:  .  nitrofurantoin (MACRODANTIN) 100 MG capsule, BID, Disp: 14 capsule, Rfl: 1   Allergies  Allergen Reactions  . Doxycycline Nausea And Vomiting  . Tetanus Toxoids Hives    ROS Review of Systems  Constitutional: Negative.   HENT: Negative.   Eyes: Negative.   Respiratory: Negative.   Cardiovascular: Negative.   Gastrointestinal: Negative.   Endocrine: Negative.   Genitourinary: Negative.   Musculoskeletal: Negative.   Skin: Negative.   Allergic/Immunologic: Negative.   Neurological: Negative.   Hematological: Negative.   Psychiatric/Behavioral: Negative.   All other systems reviewed and are negative.     Objective:    Physical Exam Vitals reviewed.  Constitutional:      Appearance: Normal appearance.  HENT:     Mouth/Throat:     Mouth: Mucous membranes are moist.  Eyes:  Pupils: Pupils are equal, round, and reactive to light.  Neck:     Vascular: No carotid bruit.  Cardiovascular:     Rate and Rhythm: Normal rate and regular rhythm.     Pulses: Normal pulses.     Heart sounds: Normal heart sounds.  Pulmonary:     Effort: Pulmonary effort is normal.     Breath sounds: Normal breath sounds.  Abdominal:     General: Bowel sounds are normal.     Palpations: Abdomen is soft. There is no hepatomegaly, splenomegaly or mass.     Tenderness: There is no abdominal tenderness.     Hernia: No hernia is present.  Musculoskeletal:        General: No tenderness.     Cervical back: Neck supple.     Right lower leg: No edema.     Left lower leg: No edema.   Skin:    Findings: No rash.  Neurological:     Mental Status: She is alert and oriented to person, place, and time.     Motor: No weakness.  Psychiatric:        Mood and Affect: Mood and affect normal.        Behavior: Behavior normal.     BP 138/78   Pulse 80   Ht 5\' 2"  (1.575 m)   Wt 145 lb (65.8 kg)   BMI 26.52 kg/m  Wt Readings from Last 3 Encounters:  01/22/21 145 lb (65.8 kg)  06/29/20 129 lb (58.5 kg)  05/23/20 138 lb (62.6 kg)     Health Maintenance Due  Topic Date Due  . TETANUS/TDAP  Never done  . DEXA SCAN  Never done  . PNA vac Low Risk Adult (2 of 2 - PCV13) 07/22/2019  . COVID-19 Vaccine (2 - Moderna risk 4-dose series) 08/15/2020    There are no preventive care reminders to display for this patient.  Lab Results  Component Value Date   TSH 4.97 (H) 06/08/2020   Lab Results  Component Value Date   WBC 5.2 06/08/2020   HGB 12.6 06/08/2020   HCT 38.9 06/08/2020   MCV 89.6 06/08/2020   PLT 237 06/08/2020   Lab Results  Component Value Date   NA 138 06/08/2020   K 4.5 06/08/2020   CO2 27 06/08/2020   GLUCOSE 100 (H) 06/08/2020   BUN 9 06/08/2020   CREATININE 0.92 (H) 06/08/2020   BILITOT 0.6 06/08/2020   AST 18 06/08/2020   ALT 9 06/08/2020   PROT 6.5 06/08/2020   CALCIUM 9.9 06/08/2020   Lab Results  Component Value Date   CHOL 149 06/08/2020   Lab Results  Component Value Date   HDL 57 06/08/2020   Lab Results  Component Value Date   LDLCALC 76 06/08/2020   Lab Results  Component Value Date   TRIG 80 06/08/2020   Lab Results  Component Value Date   CHOLHDL 2.6 06/08/2020   No results found for: HGBA1C    Assessment & Plan:   Problem List Items Addressed This Visit      Digestive   Gastroesophageal reflux disease without esophagitis    - The patient's GERD is stable on medication.  - Instructed the patient to avoid eating spicy and acidic foods, as well as foods high in fat. - Instructed the patient to avoid  eating large meals or meals 2-3 hours prior to sleeping.        Other   Shortness of breath  Patient shortness of breath is due to anxiety.      Frequency of urination - Primary    Urine sediment is still abnormal we will continue Macrodantin for another week.      Relevant Medications   nitrofurantoin (MACRODANTIN) 100 MG capsule   Other Relevant Orders   POCT urinalysis dipstick (Completed)   Mixed hyperlipidemia    Hypercholesterolemia  I advised the patient to follow Mediterranean diet This diet is rich in fruits vegetables and whole grain, and This diet is also rich in fish and lean meat Patient should also eat a handful of almonds or walnuts daily Recent heart study indicated that average follow-up on this kind of diet reduces the cardiovascular mortality by 50 to 70%==         Meds ordered this encounter  Medications  . nitrofurantoin (MACRODANTIN) 100 MG capsule    Sig: BID    Dispense:  14 capsule    Refill:  1    Follow-up: No follow-ups on file.    Cletis Athens, MD

## 2021-01-22 NOTE — Assessment & Plan Note (Signed)
Patient shortness of breath is due to anxiety.

## 2021-02-01 DIAGNOSIS — Z79899 Other long term (current) drug therapy: Secondary | ICD-10-CM | POA: Diagnosis not present

## 2021-02-18 ENCOUNTER — Other Ambulatory Visit: Payer: Self-pay | Admitting: Internal Medicine

## 2021-02-21 DIAGNOSIS — R1032 Left lower quadrant pain: Secondary | ICD-10-CM | POA: Diagnosis not present

## 2021-02-21 DIAGNOSIS — R194 Change in bowel habit: Secondary | ICD-10-CM | POA: Diagnosis not present

## 2021-02-21 DIAGNOSIS — R1031 Right lower quadrant pain: Secondary | ICD-10-CM | POA: Diagnosis not present

## 2021-02-26 ENCOUNTER — Other Ambulatory Visit: Payer: Self-pay

## 2021-02-26 ENCOUNTER — Ambulatory Visit (INDEPENDENT_AMBULATORY_CARE_PROVIDER_SITE_OTHER): Payer: Medicare Other | Admitting: Internal Medicine

## 2021-02-26 VITALS — BP 151/83 | HR 78

## 2021-02-26 DIAGNOSIS — E782 Mixed hyperlipidemia: Secondary | ICD-10-CM

## 2021-02-26 DIAGNOSIS — Z950 Presence of cardiac pacemaker: Secondary | ICD-10-CM | POA: Diagnosis not present

## 2021-02-26 DIAGNOSIS — K219 Gastro-esophageal reflux disease without esophagitis: Secondary | ICD-10-CM | POA: Diagnosis not present

## 2021-02-26 DIAGNOSIS — I480 Paroxysmal atrial fibrillation: Secondary | ICD-10-CM

## 2021-03-04 ENCOUNTER — Encounter: Payer: Self-pay | Admitting: Internal Medicine

## 2021-03-04 NOTE — Assessment & Plan Note (Signed)
Atrial fibrillation is under control.  There is no rapid ventricular response.

## 2021-03-04 NOTE — Assessment & Plan Note (Signed)
Pacemaker is working well.  Battery life is 21 months.

## 2021-03-04 NOTE — Progress Notes (Signed)
Established Patient Office Visit  Subjective:  Patient ID: Jessica Browning, female    DOB: 25-Nov-1933  Age: 87 y.o. MRN: [id number]  CC:  Chief Complaint  Patient presents with  . Pacemaker Check    HPI  Jessica Browning presents for Patient came in for the pacemaker check.,  She denies any chest pain or shortness of breath, she denies any arrhythmia.,  She does not smoke she does not drink.   Past Medical History:  Diagnosis Date  . Basal cell carcinoma 06/18/2016   Posterior neck. Nodular pattern.  . Hypertension     Past Surgical History:  Procedure Laterality Date  . ABDOMINAL HYSTERECTOMY    . BLADDER SURGERY    . VEIN LIGATION AND STRIPPING     bilateral legs    History reviewed. No pertinent family history.  Social History   Socioeconomic History  . Marital status: Single    Spouse name: Not on file  . Number of children: Not on file  . Years of education: Not on file  . Highest education level: Not on file  Occupational History  . Not on file  Tobacco Use  . Smoking status: Never Smoker  . Smokeless tobacco: Never Used  Substance and Sexual Activity  . Alcohol use: Never  . Drug use: Never  . Sexual activity: Not Currently  Other Topics Concern  . Not on file  Social History Narrative  . Not on file   Social Determinants of Health   Financial Resource Strain: Not on file  Food Insecurity: Not on file  Transportation Needs: Not on file  Physical Activity: Not on file  Stress: Not on file  Social Connections: Not on file  Intimate Partner Violence: Not on file     Current Outpatient Medications:  .  ALPRAZolam (XANAX) 0.25 MG tablet, Take 1 tablet (0.25 mg total) by mouth 2 (two) times daily., Disp: 60 tablet, Rfl: 1 .  amLODipine (NORVASC) 5 MG tablet, TAKE 1 TABLET BY MOUTH EVERY DAY, Disp: 90 tablet, Rfl: 2 .  aspirin EC 81 MG tablet, Take 81 mg by mouth daily. Swallow whole., Disp: , Rfl:  .  atorvastatin (LIPITOR) 10 MG tablet,  TAKE 1 TABLET BY MOUTH EVERY DAY, Disp: 90 tablet, Rfl: 3 .  CALCIUM 1000 + D 1000-800 MG-UNIT TABS, Take 1 tablet by mouth daily., Disp: 30 tablet, Rfl: 6 .  EPINEPHrine 0.3 mg/0.3 mL IJ SOAJ injection, Inject 0.3 mg into the muscle as needed for anaphylaxis., Disp: , Rfl:  .  nitrofurantoin (MACRODANTIN) 100 MG capsule, BID, Disp: 14 capsule, Rfl: 1   Allergies  Allergen Reactions  . Doxycycline Nausea And Vomiting  . Tetanus Toxoids Hives    ROS Review of Systems  Constitutional: Negative.   HENT: Negative.   Eyes: Negative.   Respiratory: Negative.   Cardiovascular: Negative.   Gastrointestinal: Negative.   Musculoskeletal: Negative.   Skin: Negative.   All other systems reviewed and are negative.     Objective:    Physical Exam Vitals reviewed.  Constitutional:      Appearance: Normal appearance.  HENT:     Mouth/Throat:     Mouth: Mucous membranes are moist.  Eyes:     Pupils: Pupils are equal, round, and reactive to light.  Neck:     Vascular: No carotid bruit.  Cardiovascular:     Rate and Rhythm: Normal rate and regular rhythm.     Pulses: Normal pulses.     Heart  sounds: Normal heart sounds.  Pulmonary:     Effort: Pulmonary effort is normal.     Breath sounds: Normal breath sounds.  Abdominal:     General: Bowel sounds are normal.     Palpations: Abdomen is soft. There is no hepatomegaly, splenomegaly or mass.     Tenderness: There is no abdominal tenderness.     Hernia: No hernia is present.  Musculoskeletal:        General: No tenderness.     Cervical back: Neck supple.     Right lower leg: No edema.     Left lower leg: No edema.  Skin:    Findings: No rash.  Neurological:     Mental Status: She is alert.     Motor: No weakness.  Psychiatric:        Mood and Affect: Affect normal.     BP (!) 151/83   Pulse 78  Wt Readings from Last 3 Encounters:  01/22/21 145 lb (65.8 kg)  06/29/20 129 lb (58.5 kg)  05/23/20 138 lb (62.6 kg)      Health Maintenance Due  Topic Date Due  . TETANUS/TDAP  Never done  . DEXA SCAN  Never done  . PNA vac Low Risk Adult (2 of 2 - PCV13) 07/22/2019  . COVID-19 Vaccine (2 - Moderna risk 4-dose series) 08/15/2020    There are no preventive care reminders to display for this patient.  Lab Results  Component Value Date   TSH 4.97 (H) 06/08/2020   Lab Results  Component Value Date   WBC 5.2 06/08/2020   HGB 12.6 06/08/2020   HCT 38.9 06/08/2020   MCV 89.6 06/08/2020   PLT 237 06/08/2020   Lab Results  Component Value Date   NA 138 06/08/2020   K 4.5 06/08/2020   CO2 27 06/08/2020   GLUCOSE 100 (H) 06/08/2020   BUN 9 06/08/2020   CREATININE 0.92 (H) 06/08/2020   BILITOT 0.6 06/08/2020   AST 18 06/08/2020   ALT 9 06/08/2020   PROT 6.5 06/08/2020   CALCIUM 9.9 06/08/2020   Lab Results  Component Value Date   CHOL 149 06/08/2020   Lab Results  Component Value Date   HDL 57 06/08/2020   Lab Results  Component Value Date   LDLCALC 76 06/08/2020   Lab Results  Component Value Date   TRIG 80 06/08/2020   Lab Results  Component Value Date   CHOLHDL 2.6 06/08/2020   No results found for: HGBA1C    Assessment & Plan:   Problem List Items Addressed This Visit      Cardiovascular and Mediastinum   Paroxysmal atrial fibrillation ([place])    Atrial fibrillation is under control.  There is no rapid ventricular response.        Digestive   Gastroesophageal reflux disease without esophagitis    Counseling  If a person has gastroesophageal reflux disease (GERD), food and stomach acid move back up into the esophagus and cause symptoms or problems such as damage to the esophagus.  Anti-reflux measures include: raising the head of the bed, avoiding tight clothing or belts, avoiding eating late at night, not lying down shortly after mealtime, and achieving weight loss.  Avoid ASA, NSAID's, caffeine, alcohol, and tobacco.   OTC Pepcid and/or Tums are often very  helpful for as needed use.   However, for persisting chronic or daily symptoms, stronger medications like Omeprazole may be needed.  You may need to avoid foods and drinks such  as: ? Coffee and tea (with or without caffeine). ? Drinks that contain alcohol. ? Energy drinks and sports drinks. ? Bubbly (carbonated) drinks or sodas. ? Chocolate and cocoa. ? Peppermint and mint flavorings. ? Garlic and onions. ? Horseradish. ? Spicy and acidic foods. These include peppers, chili powder, curry powder, vinegar, hot sauces, and BBQ sauce. ? Citrus fruit juices and citrus fruits, such as oranges, lemons, and limes. ? Tomato-based foods. These include red sauce, chili, salsa, and pizza with red sauce. ? Fried and fatty foods. These include donuts, french fries, potato chips, and high-fat dressings. ? High-fat meats. These include hot dogs, rib eye steak, sausage, ham, and bacon.         Other   Cardiac pacemaker in situ - Primary    Pacemaker is working well.  Battery life is 21 months.      Relevant Orders   PACEMAKER IN CLINIC CHECK   Mixed hyperlipidemia    Hypercholesterolemia  I advised the patient to follow Mediterranean diet This diet is rich in fruits vegetables and whole grain, and This diet is also rich in fish and lean meat Patient should also eat a handful of almonds or walnuts daily Recent heart study indicated that average follow-up on this kind of diet reduces the cardiovascular mortality by 50 to 70%==       Note: Medical Device Follow-up  Patient pacemaker was interrogated by pacemakers analyzer, battery status is okay.  No programming changes were indicated after the review of the data.  Histogram shows no change since the last interrogation Atrial and ventricular sensing thresholds were found to be acceptable Impedance was checked and it was found to be normal.  Thresholds were found to be okay on evaluation of rhythm problem.  No high rate or low rate arrhythmia  were noted.  Estimated battery longevity is 21 months. I have personally reviewed the device data and amended the report as necessary.   No orders of the defined types were placed in this encounter.   Follow-up: No follow-ups on file.    Cletis Athens, MD

## 2021-03-04 NOTE — Assessment & Plan Note (Signed)
Hypercholesterolemia  I advised the patient to follow Mediterranean diet This diet is rich in fruits vegetables and whole grain, and This diet is also rich in fish and lean meat Patient should also eat a handful of almonds or walnuts daily Recent heart study indicated that average follow-up on this kind of diet reduces the cardiovascular mortality by 50 to 70%== 

## 2021-03-04 NOTE — Assessment & Plan Note (Signed)

## 2021-03-06 ENCOUNTER — Other Ambulatory Visit: Payer: Self-pay

## 2021-03-06 MED ORDER — CALCIUM 1000 + D 1000-800 MG-UNIT PO TABS: 1.0000 | ORAL_TABLET | Freq: Every day | ORAL | 6 refills | Status: DC

## 2021-03-15 DIAGNOSIS — R1032 Left lower quadrant pain: Secondary | ICD-10-CM | POA: Diagnosis not present

## 2021-03-15 DIAGNOSIS — K635 Polyp of colon: Secondary | ICD-10-CM | POA: Diagnosis not present

## 2021-03-15 DIAGNOSIS — R1031 Right lower quadrant pain: Secondary | ICD-10-CM | POA: Diagnosis not present

## 2021-03-15 DIAGNOSIS — R194 Change in bowel habit: Secondary | ICD-10-CM | POA: Diagnosis not present

## 2021-03-15 DIAGNOSIS — Z1211 Encounter for screening for malignant neoplasm of colon: Secondary | ICD-10-CM | POA: Diagnosis not present

## 2021-04-30 ENCOUNTER — Other Ambulatory Visit: Payer: Self-pay

## 2021-04-30 ENCOUNTER — Ambulatory Visit (INDEPENDENT_AMBULATORY_CARE_PROVIDER_SITE_OTHER): Payer: Medicare Other | Admitting: Internal Medicine

## 2021-04-30 ENCOUNTER — Encounter: Payer: Self-pay | Admitting: Internal Medicine

## 2021-04-30 ENCOUNTER — Ambulatory Visit: Payer: 59 | Admitting: Internal Medicine

## 2021-04-30 VITALS — BP 138/80 | HR 75 | Ht 62.0 in | Wt 141.1 lb

## 2021-04-30 DIAGNOSIS — Z950 Presence of cardiac pacemaker: Secondary | ICD-10-CM | POA: Diagnosis not present

## 2021-04-30 DIAGNOSIS — G5603 Carpal tunnel syndrome, bilateral upper limbs: Secondary | ICD-10-CM | POA: Diagnosis not present

## 2021-04-30 DIAGNOSIS — F419 Anxiety disorder, unspecified: Secondary | ICD-10-CM

## 2021-04-30 DIAGNOSIS — K219 Gastro-esophageal reflux disease without esophagitis: Secondary | ICD-10-CM

## 2021-04-30 DIAGNOSIS — Z0000 Encounter for general adult medical examination without abnormal findings: Secondary | ICD-10-CM | POA: Diagnosis not present

## 2021-04-30 DIAGNOSIS — I480 Paroxysmal atrial fibrillation: Secondary | ICD-10-CM | POA: Diagnosis not present

## 2021-04-30 NOTE — Assessment & Plan Note (Signed)
Stable at the present time. 

## 2021-04-30 NOTE — Assessment & Plan Note (Signed)
?-   Encouraged patient to engage in relaxing activities like yoga, meditation, journaling, going for a walk, or participating in a hobby.  ?- Encouraged patient to reach out to trusted friends or family members about recent struggles, ?Patient was advised to read A book, how to stop worrying and start living, it is good book to read to control  the stress ? ?

## 2021-04-30 NOTE — Assessment & Plan Note (Signed)
Pacemaker working well according to the pacemaker clinic

## 2021-04-30 NOTE — Progress Notes (Signed)
Established Patient Office Visit  Subjective:  Patient ID: Jessica Browning, female    DOB: 03-22-1934  Age: 87 y.o. MRN: [id number]  CC:  Chief Complaint  Patient presents with   Annual Exam    HPI  Jessica Browning presents for physical  Past Medical History:  Diagnosis Date   Basal cell carcinoma 06/18/2016   Posterior neck. Nodular pattern.   Hypertension     Past Surgical History:  Procedure Laterality Date   ABDOMINAL HYSTERECTOMY     BLADDER SURGERY     VEIN LIGATION AND STRIPPING     bilateral legs    History reviewed. No pertinent family history.  Social History   Socioeconomic History   Marital status: Single    Spouse name: Not on file   Number of children: Not on file   Years of education: Not on file   Highest education level: Not on file  Occupational History   Not on file  Tobacco Use   Smoking status: Never   Smokeless tobacco: Never  Substance and Sexual Activity   Alcohol use: Never   Drug use: Never   Sexual activity: Not Currently  Other Topics Concern   Not on file  Social History Narrative   Not on file   Social Determinants of Health   Financial Resource Strain: Not on file  Food Insecurity: Not on file  Transportation Needs: Not on file  Physical Activity: Not on file  Stress: Not on file  Social Connections: Not on file  Intimate Partner Violence: Not on file     Current Outpatient Medications:    ALPRAZolam (XANAX) 0.25 MG tablet, Take 1 tablet (0.25 mg total) by mouth 2 (two) times daily., Disp: 60 tablet, Rfl: 1   amLODipine (NORVASC) 5 MG tablet, TAKE 1 TABLET BY MOUTH EVERY DAY, Disp: 90 tablet, Rfl: 2   aspirin EC 81 MG tablet, Take 81 mg by mouth daily. Swallow whole., Disp: , Rfl:    atorvastatin (LIPITOR) 10 MG tablet, TAKE 1 TABLET BY MOUTH EVERY DAY, Disp: 90 tablet, Rfl: 3   CALCIUM 1000 + D 1000-800 MG-UNIT TABS, Take 1 tablet by mouth daily., Disp: 30 tablet, Rfl: 6   EPINEPHrine 0.3 mg/0.3 mL IJ SOAJ  injection, Inject 0.3 mg into the muscle as needed for anaphylaxis., Disp: , Rfl:    nitrofurantoin (MACRODANTIN) 100 MG capsule, BID, Disp: 14 capsule, Rfl: 1   Allergies  Allergen Reactions   Doxycycline Nausea And Vomiting   Tetanus Toxoids Hives    ROS Review of Systems  Constitutional: Negative.   HENT: Negative.    Eyes: Negative.   Respiratory: Negative.    Cardiovascular: Negative.   Gastrointestinal: Negative.   Endocrine: Negative.   Genitourinary: Negative.   Musculoskeletal: Negative.   Skin: Negative.   Allergic/Immunologic: Negative.   Neurological: Negative.   Hematological: Negative.   Psychiatric/Behavioral: Negative.    All other systems reviewed and are negative.    Objective:    Physical Exam Vitals reviewed.  Constitutional:      Appearance: Normal appearance.  HENT:     Mouth/Throat:     Mouth: Mucous membranes are moist.  Eyes:     Pupils: Pupils are equal, round, and reactive to light.  Neck:     Vascular: No carotid bruit.  Cardiovascular:     Rate and Rhythm: Normal rate and regular rhythm.     Pulses: Normal pulses.     Heart sounds: Normal heart sounds.  Pulmonary:  Effort: Pulmonary effort is normal.     Breath sounds: Normal breath sounds.  Abdominal:     General: Bowel sounds are normal.     Palpations: Abdomen is soft. There is no hepatomegaly, splenomegaly or mass.     Tenderness: There is no abdominal tenderness.     Hernia: No hernia is present.  Musculoskeletal:        General: No tenderness.     Cervical back: Neck supple.     Right lower leg: No edema.     Left lower leg: No edema.  Skin:    Findings: No rash.  Neurological:     Mental Status: She is alert and oriented to person, place, and time.     Motor: No weakness.  Psychiatric:        Mood and Affect: Mood and affect normal.        Behavior: Behavior normal.    BP 138/80   Pulse 75   Ht 5\' 2"  (1.575 m)   Wt 141 lb 1.6 oz (64 kg)   BMI 25.81 kg/m   Wt Readings from Last 3 Encounters:  04/30/21 141 lb 1.6 oz (64 kg)  01/22/21 145 lb (65.8 kg)  06/29/20 129 lb (58.5 kg)     Health Maintenance Due  Topic Date Due   COVID-19 Vaccine (1) Never done   TETANUS/TDAP  Never done   Zoster Vaccines- Shingrix (1 of 2) Never done   DEXA SCAN  Never done   PNA vac Low Risk Adult (1 of 2 - PCV13) Never done    There are no preventive care reminders to display for this patient.  Lab Results  Component Value Date   TSH 4.97 (H) 06/08/2020   Lab Results  Component Value Date   WBC 5.2 06/08/2020   HGB 12.6 06/08/2020   HCT 38.9 06/08/2020   MCV 89.6 06/08/2020   PLT 237 06/08/2020   Lab Results  Component Value Date   NA 138 06/08/2020   K 4.5 06/08/2020   CO2 27 06/08/2020   GLUCOSE 100 (H) 06/08/2020   BUN 9 06/08/2020   CREATININE 0.92 (H) 06/08/2020   BILITOT 0.6 06/08/2020   AST 18 06/08/2020   ALT 9 06/08/2020   PROT 6.5 06/08/2020   CALCIUM 9.9 06/08/2020   Lab Results  Component Value Date   CHOL 149 06/08/2020   Lab Results  Component Value Date   HDL 57 06/08/2020   Lab Results  Component Value Date   LDLCALC 76 06/08/2020   Lab Results  Component Value Date   TRIG 80 06/08/2020   Lab Results  Component Value Date   CHOLHDL 2.6 06/08/2020   No results found for: HGBA1C    Assessment & Plan:   Problem List Items Addressed This Visit       Cardiovascular and Mediastinum   Paroxysmal atrial fibrillation ([place]) - Primary    Patient is in normal sinus rhythm EKG was done today and it was normal         Digestive   Gastroesophageal reflux disease without esophagitis    Counseling  If a person has gastroesophageal reflux disease (GERD), food and stomach acid move back up into the esophagus and cause symptoms or problems such as damage to the esophagus.  Anti-reflux measures include: raising the head of the bed, avoiding tight clothing or belts, avoiding eating late at night, not lying down  shortly after mealtime, and achieving weight loss.  Avoid ASA, NSAID's, caffeine,  alcohol, and tobacco.   OTC Pepcid and/or Tums are often very helpful for as needed use.   However, for persisting chronic or daily symptoms, stronger medications like Omeprazole may be needed.  You may need to avoid foods and drinks such as: ? Coffee and tea (with or without caffeine). ? Drinks that contain alcohol. ? Energy drinks and sports drinks. ? Bubbly (carbonated) drinks or sodas. ? Chocolate and cocoa. ? Peppermint and mint flavorings. ? Garlic and onions. ? Horseradish. ? Spicy and acidic foods. These include peppers, chili powder, curry powder, vinegar, hot sauces, and BBQ sauce. ? Citrus fruit juices and citrus fruits, such as oranges, lemons, and limes. ? Tomato-based foods. These include red sauce, chili, salsa, and pizza with red sauce. ? Fried and fatty foods. These include donuts, french fries, potato chips, and high-fat dressings. ? High-fat meats. These include hot dogs, rib eye steak, sausage, ham, and bacon.        Relevant Orders   CBC with Differential/Platelet     Nervous and Auditory   Bilateral carpal tunnel syndrome    Stable at the present time         Other   Cardiac pacemaker in situ    Pacemaker working well according to the pacemaker clinic       Anxiety    - - Encouraged patient to engage in relaxing activities like yoga, meditation, journaling, going for a walk, or participating in a hobby.  - Encouraged patient to reach out to trusted friends or family members about recent struggles, Patient was advised to read A book, how to stop worrying and start living, it is good book to read to control  the stress        Encounter for preventive care    Patient was advised to walk 20 minutes every day       Other Visit Diagnoses     Annual physical exam       Relevant Orders   EKG 12-Lead   COMPLETE METABOLIC PANEL WITH GFR   Lipid panel   TSH      Electrocardiogram report Normal sinus rhythm #2 nonspecific ST-T abnormalities #3 no acute changes were noted.  No orders of the defined types were placed in this encounter.   Follow-up: No follow-ups on file.    Cletis Athens, MD

## 2021-04-30 NOTE — Assessment & Plan Note (Signed)
Patient was advised to walk 20 minutes every day

## 2021-04-30 NOTE — Assessment & Plan Note (Signed)
Patient is in normal sinus rhythm EKG was done today and it was normal

## 2021-04-30 NOTE — Assessment & Plan Note (Signed)

## 2021-05-01 DIAGNOSIS — R300 Dysuria: Secondary | ICD-10-CM | POA: Diagnosis not present

## 2021-05-01 DIAGNOSIS — N281 Cyst of kidney, acquired: Secondary | ICD-10-CM | POA: Diagnosis not present

## 2021-05-01 LAB — CBC WITH DIFFERENTIAL/PLATELET
Absolute Monocytes: 783 cells/uL (ref 200–950)
Basophils Absolute: 32 cells/uL (ref 0–200)
Basophils Relative: 0.6 %
Eosinophils Absolute: 81 cells/uL (ref 15–500)
Eosinophils Relative: 1.5 %
HCT: 40.8 % (ref 35.0–45.0)
Hemoglobin: 13 g/dL (ref 11.7–15.5)
Lymphs Abs: 1777 cells/uL (ref 850–3900)
MCH: 28.9 pg (ref 27.0–33.0)
MCHC: 31.9 g/dL — ABNORMAL LOW (ref 32.0–36.0)
MCV: 90.7 fL (ref 80.0–100.0)
MPV: 11 fL (ref 7.5–12.5)
Monocytes Relative: 14.5 %
Neutro Abs: 2727 cells/uL (ref 1500–7800)
Neutrophils Relative %: 50.5 %
Platelets: 243 10*3/uL (ref 140–400)
RBC: 4.5 10*6/uL (ref 3.80–5.10)
RDW: 13.1 % (ref 11.0–15.0)
Total Lymphocyte: 32.9 %
WBC: 5.4 10*3/uL (ref 3.8–10.8)

## 2021-05-01 LAB — COMPLETE METABOLIC PANEL WITH GFR
AG Ratio: 1.8 (calc) (ref 1.0–2.5)
ALT: 10 U/L (ref 6–29)
AST: 17 U/L (ref 10–35)
Albumin: 4.3 g/dL (ref 3.6–5.1)
Alkaline phosphatase (APISO): 88 U/L (ref 37–153)
BUN: 11 mg/dL (ref 7–25)
CO2: 28 mmol/L (ref 20–32)
Calcium: 10.3 mg/dL (ref 8.6–10.4)
Chloride: 105 mmol/L (ref 98–110)
Creat: 0.88 mg/dL (ref 0.60–0.95)
Globulin: 2.4 g/dL (calc) (ref 1.9–3.7)
Glucose, Bld: 95 mg/dL (ref 65–99)
Potassium: 4.4 mmol/L (ref 3.5–5.3)
Sodium: 141 mmol/L (ref 135–146)
Total Bilirubin: 0.4 mg/dL (ref 0.2–1.2)
Total Protein: 6.7 g/dL (ref 6.1–8.1)
eGFR: 65 mL/min/{1.73_m2} (ref >=60)

## 2021-05-01 LAB — LIPID PANEL
Cholesterol: 153 mg/dL (ref <200)
HDL: 61 mg/dL (ref >=50)
LDL Cholesterol (Calc): 74 mg/dL (calc)
Non-HDL Cholesterol (Calc): 92 mg/dL (calc) (ref <130)
Total CHOL/HDL Ratio: 2.5 (calc) (ref <5.0)
Triglycerides: 92 mg/dL (ref <150)

## 2021-05-01 LAB — TSH: TSH: 4.26 mIU/L (ref 0.40–4.50)

## 2021-05-04 ENCOUNTER — Other Ambulatory Visit: Payer: Self-pay

## 2021-05-04 ENCOUNTER — Ambulatory Visit
Admission: EM | Admit: 2021-05-04 | Discharge: 2021-05-04 | Disposition: A | Discharge status: Home / Self Care | Payer: Medicare Other | Attending: Family Medicine | Admitting: Family Medicine

## 2021-05-04 DIAGNOSIS — H9311 Tinnitus, right ear: Secondary | ICD-10-CM

## 2021-05-04 NOTE — Discharge Instructions (Addendum)
No more Qtips.  No wax and your eardrum looked normal.  If persists, see ENT.  Take care  Dr. Lacinda Axon

## 2021-05-04 NOTE — ED Triage Notes (Signed)
Pt c/o loss of hearing in her right ear today. Pt does generally wear a hearing aid in both ears. Pt states she can hear a roaring sound. Pt states she did try to clean her ear with a qtip and baby oil but this has not helped. Pt denies any pain in the ear.

## 2021-05-04 NOTE — ED Provider Notes (Signed)
[facility]    CSN: [id number] Arrival date & time: 05/04/21  1303  History   Chief Complaint Chief Complaint  Patient presents with   Hearing Problem    Right ear    HPI 87 year old female presents with a right ear issue.  Patient complaints of difficulty hearing from the right ear. Reports a "roaring" in the right ear. She thought she may have a cerumen impaction and attempted to clean her ear with baby oil and a Qtip. She noticed some blood on the Qtip after use. No pain. No other associated symptoms.   Past Medical History:  Diagnosis Date   Basal cell carcinoma 06/18/2016   Posterior neck. Nodular pattern.   Hypertension     Patient Active Problem List   Diagnosis Date Noted   Encounter for preventive care 04/30/2021   Need for influenza vaccination 06/29/2020   Mixed hyperlipidemia 06/29/2020   Frequency of urination 05/23/2020   Anxiety 03/23/2020   Bilateral carpal tunnel syndrome 03/23/2020   Shortness of breath 03/23/2020   Gastroesophageal reflux disease without esophagitis 03/23/2020   Paroxysmal atrial fibrillation ([place]) 02/24/2020   Cardiac pacemaker in situ 02/24/2020    Past Surgical History:  Procedure Laterality Date   ABDOMINAL HYSTERECTOMY     BLADDER SURGERY     VEIN LIGATION AND STRIPPING     bilateral legs    OB History   No obstetric history on file.      Home Medications    Prior to Admission medications   Medication Sig Start Date End Date Taking? Authorizing Provider  ALPRAZolam (XANAX) 0.25 MG tablet Take 1 tablet (0.25 mg total) by mouth 2 (two) times daily. 12/18/20  Yes Masoud, Viann Shove, MD  amLODipine (NORVASC) 5 MG tablet TAKE 1 TABLET BY MOUTH EVERY DAY 02/20/21  Yes Cletis Athens, MD  aspirin EC 81 MG tablet Take 81 mg by mouth daily. Swallow whole.   Yes [provider]  atorvastatin (LIPITOR) 10 MG tablet TAKE 1 TABLET BY MOUTH EVERY DAY 07/06/20  Yes Masoud, Viann Shove, MD  CALCIUM 1000 + D 1000-800 MG-UNIT  TABS Take 1 tablet by mouth daily. 03/06/21  Yes Masoud, Viann Shove, MD  EPINEPHrine 0.3 mg/0.3 mL IJ SOAJ injection Inject 0.3 mg into the muscle as needed for anaphylaxis.   Yes [provider]  nitrofurantoin (MACRODANTIN) 100 MG capsule BID 01/22/21  Yes Cletis Athens, MD   Social History Social History   Tobacco Use   Smoking status: Never   Smokeless tobacco: Never  Substance Use Topics   Alcohol use: Never   Drug use: Never     Allergies   Doxycycline and Tetanus toxoids   Review of Systems Review of Systems Per HPI  Physical Exam Triage Vital Signs ED Triage Vitals  Enc Vitals Group     BP 05/04/21 1338 (!) 152/84     Pulse Rate 05/04/21 1338 77     Resp 05/04/21 1338 18     Temp 05/04/21 1338 98.1 F (36.7 C)     Temp Source 05/04/21 1338 Oral     SpO2 05/04/21 1338 99 %     Weight 05/04/21 1336 141 lb (64 kg)     Height 05/04/21 1336 5\' 4"  (1.626 m)     Head Circumference --      Peak Flow --      Pain Score 05/04/21 1336 0     Pain Loc --      Pain Edu? --  Excl. in GC? --    No data found.  Updated Vital Signs BP (!) 152/84 (BP Location: Left Arm)   Pulse 77   Temp 98.1 F (36.7 C) (Oral)   Resp 18   Ht 5\' 4"  (1.626 m)   Wt 64 kg   SpO2 99%   BMI 24.20 kg/m   Visual Acuity Right Eye Distance:   Left Eye Distance:   Bilateral Distance:    Right Eye Near:   Left Eye Near:    Bilateral Near:     Physical Exam Vitals and nursing note reviewed.  Constitutional:      General: She is not in acute distress.    Appearance: Normal appearance. She is not ill-appearing.  HENT:     Head: Normocephalic and atraumatic.     Left Ear: Tympanic membrane and ear canal normal.     Ears:     Comments: Right ear - mild erythema of the canal with a small area of bleeding (recent; no active bleeding). This is secondary to trauma from Qtip. Normal TM.  Eyes:     General:        Right eye: No discharge.        Left eye: No discharge.      Conjunctiva/sclera: Conjunctivae normal.  Pulmonary:     Effort: Pulmonary effort is normal. No respiratory distress.  Neurological:     Mental Status: She is alert.     UC Treatments / Results  Labs (all labs ordered are listed, but only abnormal results are displayed) Labs Reviewed - No data to display  EKG   Radiology No results found.  Procedures Procedures (including critical care time)  Medications Ordered in UC Medications - No data to display  Initial Impression / Assessment and Plan / UC Course  I have reviewed the triage vital signs and the nursing notes.  Pertinent labs & imaging results that were available during my care of the patient were reviewed by me and considered in my medical decision making (see chart for details).    87 year old female presents with tinnitus. Exam notable for mild trauma due to Qtip (canal). Normal TM. No cerumen impaction. Supportive care. Follow up with ENT.  Final Clinical Impressions(s) / UC Diagnoses   Final diagnoses:  Tinnitus of right ear     Discharge Instructions      No more Qtips.  No wax and your eardrum looked normal.  If persists, see ENT.  Take care  Dr. Lacinda Axon    ED Prescriptions   None    PDMP not reviewed this encounter.   Coral Spikes, [place] 05/04/21 2311

## 2021-05-28 ENCOUNTER — Ambulatory Visit (INDEPENDENT_AMBULATORY_CARE_PROVIDER_SITE_OTHER): Payer: Medicare Other | Admitting: Internal Medicine

## 2021-05-28 ENCOUNTER — Other Ambulatory Visit: Payer: Self-pay

## 2021-05-28 VITALS — BP 151/86 | HR 78

## 2021-05-28 DIAGNOSIS — Z950 Presence of cardiac pacemaker: Secondary | ICD-10-CM

## 2021-05-31 ENCOUNTER — Encounter: Payer: Self-pay | Admitting: Internal Medicine

## 2021-05-31 NOTE — Progress Notes (Signed)
Established Patient Office Visit  Subjective:  Patient ID: Jessica Browning, female    DOB: May 02, 1934  Age: 87 y.o. MRN: [id number]  CC: No chief complaint on file.   HPI  Jessica Browning presents for pacemaker check, she denies any chest pain she has a problem with anxiety.  She does not smoke does not drink.  Denies any history of orthopnea.  There is no swelling of the leg.  Past Medical History:  Diagnosis Date   Basal cell carcinoma 06/18/2016   Posterior neck. Nodular pattern.   Hypertension     Past Surgical History:  Procedure Laterality Date   ABDOMINAL HYSTERECTOMY     BLADDER SURGERY     VEIN LIGATION AND STRIPPING     bilateral legs    No family history on file.  Social History   Socioeconomic History   Marital status: Single    Spouse name: Not on file   Number of children: Not on file   Years of education: Not on file   Highest education level: Not on file  Occupational History   Not on file  Tobacco Use   Smoking status: Never   Smokeless tobacco: Never  Substance and Sexual Activity   Alcohol use: Never   Drug use: Never   Sexual activity: Not Currently  Other Topics Concern   Not on file  Social History Narrative   Not on file   Social Determinants of Health   Financial Resource Strain: Not on file  Food Insecurity: Not on file  Transportation Needs: Not on file  Physical Activity: Not on file  Stress: Not on file  Social Connections: Not on file  Intimate Partner Violence: Not on file     Current Outpatient Medications:    ALPRAZolam (XANAX) 0.25 MG tablet, Take 1 tablet (0.25 mg total) by mouth 2 (two) times daily., Disp: 60 tablet, Rfl: 1   amLODipine (NORVASC) 5 MG tablet, TAKE 1 TABLET BY MOUTH EVERY DAY, Disp: 90 tablet, Rfl: 2   aspirin EC 81 MG tablet, Take 81 mg by mouth daily. Swallow whole., Disp: , Rfl:    atorvastatin (LIPITOR) 10 MG tablet, TAKE 1 TABLET BY MOUTH EVERY DAY, Disp: 90 tablet, Rfl: 3   CALCIUM  1000 + D 1000-800 MG-UNIT TABS, Take 1 tablet by mouth daily., Disp: 30 tablet, Rfl: 6   EPINEPHrine 0.3 mg/0.3 mL IJ SOAJ injection, Inject 0.3 mg into the muscle as needed for anaphylaxis., Disp: , Rfl:    nitrofurantoin (MACRODANTIN) 100 MG capsule, BID, Disp: 14 capsule, Rfl: 1   Allergies  Allergen Reactions   Doxycycline Nausea And Vomiting   Tetanus Toxoids Hives    ROS Review of Systems  Constitutional: Negative.   HENT: Negative.    Eyes: Negative.   Respiratory: Negative.    Cardiovascular: Negative.   Gastrointestinal: Negative.   Endocrine: Negative.   Genitourinary: Negative.   Musculoskeletal: Negative.   Skin: Negative.   Allergic/Immunologic: Negative.   Neurological: Negative.   Hematological: Negative.   Psychiatric/Behavioral: Negative.    All other systems reviewed and are negative.    Objective:    Physical Exam Vitals reviewed.  Constitutional:      Appearance: Normal appearance.  HENT:     Mouth/Throat:     Mouth: Mucous membranes are moist.  Eyes:     Pupils: Pupils are equal, round, and reactive to light.  Neck:     Vascular: No carotid bruit.  Cardiovascular:  Rate and Rhythm: Normal rate and regular rhythm.     Pulses: Normal pulses.     Heart sounds: Normal heart sounds.  Pulmonary:     Effort: Pulmonary effort is normal.     Breath sounds: Normal breath sounds.  Abdominal:     General: Bowel sounds are normal.     Palpations: Abdomen is soft. There is no hepatomegaly, splenomegaly or mass.     Tenderness: There is no abdominal tenderness.     Hernia: No hernia is present.  Musculoskeletal:        General: No tenderness.     Cervical back: Neck supple.     Right lower leg: No edema.     Left lower leg: No edema.  Skin:    Findings: No rash.  Neurological:     Mental Status: She is alert and oriented to person, place, and time.     Motor: No weakness.  Psychiatric:        Mood and Affect: Mood and affect normal.         Behavior: Behavior normal.    BP (!) 151/86   Pulse 78  Wt Readings from Last 3 Encounters:  05/04/21 141 lb (64 kg)  04/30/21 141 lb 1.6 oz (64 kg)  01/22/21 145 lb (65.8 kg)     Health Maintenance Due  Topic Date Due   DEXA SCAN  Never done   Zoster Vaccines- Shingrix (2 of 2) 01/26/2013   PNA vac Low Risk Adult (2 of 2 - PCV13) 07/22/2019   TETANUS/TDAP  12/29/2019   COVID-19 Vaccine (5 - Booster for Moderna series) 12/19/2020   INFLUENZA VACCINE  05/21/2021    There are no preventive care reminders to display for this patient.  Lab Results  Component Value Date   TSH 4.26 04/30/2021   Lab Results  Component Value Date   WBC 5.4 04/30/2021   HGB 13.0 04/30/2021   HCT 40.8 04/30/2021   MCV 90.7 04/30/2021   PLT 243 04/30/2021   Lab Results  Component Value Date   NA 141 04/30/2021   K 4.4 04/30/2021   CO2 28 04/30/2021   GLUCOSE 95 04/30/2021   BUN 11 04/30/2021   CREATININE 0.88 04/30/2021   BILITOT 0.4 04/30/2021   AST 17 04/30/2021   ALT 10 04/30/2021   PROT 6.7 04/30/2021   CALCIUM 10.3 04/30/2021   EGFR 65 04/30/2021   Lab Results  Component Value Date   CHOL 153 04/30/2021   Lab Results  Component Value Date   HDL 61 04/30/2021   Lab Results  Component Value Date   LDLCALC 74 04/30/2021   Lab Results  Component Value Date   TRIG 92 04/30/2021   Lab Results  Component Value Date   CHOLHDL 2.5 04/30/2021   No results found for: HGBA1C    ASSESSMENT & PLAN:   Problem List Items Addressed This Visit       Other   Cardiac pacemaker in situ - Primary   Relevant Orders   PACEMAKER IN CLINIC CHECK   Lab Results  Component Value Date   WBC 5.4 04/30/2021   HGB 13.0 04/30/2021   HCT 40.8 04/30/2021   MCV 90.7 04/30/2021   PLT 243 04/30/2021   Note: Medical Device Follow-up  Patient pacemaker was interrogated by pacemakers analyzer, battery status is okay.  No programming changes were indicated after the review of the data.   Histogram shows no change since the last interrogation Atrial and ventricular  sensing thresholds were found to be acceptable Impedance was checked and it was found to be normal.  Thresholds were found to be okay on evaluation of rhythm problem.  No high rate or low rate arrhythmia were noted.  Estimated battery longevity is 98month. I have personally reviewed the device data and amended the report as necessary.   No orders of the defined types were placed in this encounter.   Follow-up: No follow-ups on file.    JCletis Athens MD Pacemaker check.

## 2021-06-07 DIAGNOSIS — N281 Cyst of kidney, acquired: Secondary | ICD-10-CM | POA: Diagnosis not present

## 2021-06-18 ENCOUNTER — Other Ambulatory Visit: Payer: Self-pay | Admitting: *Deleted

## 2021-06-18 DIAGNOSIS — F419 Anxiety disorder, unspecified: Secondary | ICD-10-CM

## 2021-06-18 MED ORDER — ALPRAZOLAM 0.25 MG PO TABS: 0.2500 mg | ORAL_TABLET | Freq: Two times a day (BID) | ORAL | 0 refills | Status: DC

## 2021-07-02 ENCOUNTER — Ambulatory Visit (INDEPENDENT_AMBULATORY_CARE_PROVIDER_SITE_OTHER): Payer: Medicare Other | Admitting: Internal Medicine

## 2021-07-02 ENCOUNTER — Other Ambulatory Visit: Payer: Self-pay

## 2021-07-02 ENCOUNTER — Encounter: Payer: Self-pay | Admitting: Internal Medicine

## 2021-07-02 VITALS — BP 130/80 | HR 65 | Ht 64.0 in | Wt 142.6 lb

## 2021-07-02 DIAGNOSIS — I480 Paroxysmal atrial fibrillation: Secondary | ICD-10-CM | POA: Diagnosis not present

## 2021-07-02 DIAGNOSIS — G5603 Carpal tunnel syndrome, bilateral upper limbs: Secondary | ICD-10-CM

## 2021-07-02 DIAGNOSIS — E782 Mixed hyperlipidemia: Secondary | ICD-10-CM | POA: Diagnosis not present

## 2021-07-02 DIAGNOSIS — K219 Gastro-esophageal reflux disease without esophagitis: Secondary | ICD-10-CM

## 2021-07-02 DIAGNOSIS — F419 Anxiety disorder, unspecified: Secondary | ICD-10-CM

## 2021-07-02 NOTE — Assessment & Plan Note (Signed)
-   The patient's GERD is stable on medication.  - Instructed the patient to avoid eating spicy and acidic foods, as well as foods high in fat. - Instructed the patient to avoid eating large meals or meals 2-3 hours prior to sleeping. 

## 2021-07-02 NOTE — Assessment & Plan Note (Signed)
Stable at the present time. 

## 2021-07-02 NOTE — Assessment & Plan Note (Signed)
Stable

## 2021-07-02 NOTE — Assessment & Plan Note (Signed)
   Keeping a stress/anxiety diary. This can help you learn what triggers your reaction and then learn ways to manage your response.  Thinking about how you react to certain situations. You may not be able to control everything, but you can control your response.  Making time for activities that help you relax and not feeling guilty about spending your time in this way.  Visual imagery and yoga can help you stay calm and relax. 

## 2021-07-02 NOTE — Progress Notes (Signed)
Established Patient Office Visit  Subjective:  Patient ID: Jessica Browning, female    DOB: 07-19-1934  Age: 87 y.o. MRN: [id number]  CC:  Chief Complaint  Patient presents with   Follow-up    Patient is here for a 2 month follow up    HPI  Jessica Browning presents for general evaluation  Past Medical History:  Diagnosis Date   Basal cell carcinoma 06/18/2016   Posterior neck. Nodular pattern.   Hypertension     Past Surgical History:  Procedure Laterality Date   ABDOMINAL HYSTERECTOMY     BLADDER SURGERY     VEIN LIGATION AND STRIPPING     bilateral legs    History reviewed. No pertinent family history.  Social History   Socioeconomic History   Marital status: Single    Spouse name: Not on file   Number of children: Not on file   Years of education: Not on file   Highest education level: Not on file  Occupational History   Not on file  Tobacco Use   Smoking status: Never   Smokeless tobacco: Never  Substance and Sexual Activity   Alcohol use: Never   Drug use: Never   Sexual activity: Not Currently  Other Topics Concern   Not on file  Social History Narrative   Not on file   Social Determinants of Health   Financial Resource Strain: Not on file  Food Insecurity: Not on file  Transportation Needs: Not on file  Physical Activity: Not on file  Stress: Not on file  Social Connections: Not on file  Intimate Partner Violence: Not on file     Current Outpatient Medications:    ALPRAZolam (XANAX) 0.25 MG tablet, Take 1 tablet (0.25 mg total) by mouth 2 (two) times daily., Disp: 60 tablet, Rfl: 0   amLODipine (NORVASC) 5 MG tablet, TAKE 1 TABLET BY MOUTH EVERY DAY, Disp: 90 tablet, Rfl: 2   aspirin EC 81 MG tablet, Take 81 mg by mouth daily. Swallow whole., Disp: , Rfl:    atorvastatin (LIPITOR) 10 MG tablet, TAKE 1 TABLET BY MOUTH EVERY DAY, Disp: 90 tablet, Rfl: 3   CALCIUM 1000 + D 1000-800 MG-UNIT TABS, Take 1 tablet by mouth daily., Disp: 30  tablet, Rfl: 6   EPINEPHrine 0.3 mg/0.3 mL IJ SOAJ injection, Inject 0.3 mg into the muscle as needed for anaphylaxis., Disp: , Rfl:    Allergies  Allergen Reactions   Doxycycline Nausea And Vomiting   Tetanus Toxoids Hives    ROS Review of Systems  Constitutional: Negative.   HENT: Negative.    Eyes: Negative.   Respiratory: Negative.    Cardiovascular: Negative.   Gastrointestinal: Negative.   Endocrine: Negative.   Genitourinary: Negative.   Musculoskeletal: Negative.   Skin: Negative.   Allergic/Immunologic: Negative.   Neurological: Negative.   Hematological: Negative.   Psychiatric/Behavioral: Negative.    All other systems reviewed and are negative.    Objective:    Physical Exam Vitals reviewed.  Constitutional:      Appearance: Normal appearance.  HENT:     Mouth/Throat:     Mouth: Mucous membranes are moist.  Eyes:     Pupils: Pupils are equal, round, and reactive to light.  Neck:     Vascular: No carotid bruit.  Cardiovascular:     Rate and Rhythm: Normal rate and regular rhythm.     Pulses: Normal pulses.     Heart sounds: Normal heart sounds.  Pulmonary:  Effort: Pulmonary effort is normal.     Breath sounds: Normal breath sounds.  Abdominal:     General: Bowel sounds are normal.     Palpations: Abdomen is soft. There is no hepatomegaly, splenomegaly or mass.     Tenderness: There is no abdominal tenderness.     Hernia: No hernia is present.  Musculoskeletal:        General: No tenderness.     Cervical back: Neck supple.     Right lower leg: No edema.     Left lower leg: No edema.  Skin:    Findings: No rash.  Neurological:     Mental Status: She is alert and oriented to person, place, and time.     Motor: No weakness.  Psychiatric:        Mood and Affect: Mood and affect normal.        Behavior: Behavior normal.    BP (!) 142/78   Pulse 65   Ht 5' 4"  (1.626 m)   Wt 142 lb 9.6 oz (64.7 kg)   BMI 24.48 kg/m  Wt Readings from  Last 3 Encounters:  07/02/21 142 lb 9.6 oz (64.7 kg)  05/04/21 141 lb (64 kg)  04/30/21 141 lb 1.6 oz (64 kg)     Health Maintenance Due  Topic Date Due   DEXA SCAN  Never done   Zoster Vaccines- Shingrix (2 of 2) 01/26/2013   PNA vac Low Risk Adult (2 of 2 - PCV13) 07/22/2019   TETANUS/TDAP  12/29/2019   COVID-19 Vaccine (5 - Booster for Moderna series) 12/19/2020   INFLUENZA VACCINE  05/21/2021    There are no preventive care reminders to display for this patient.  Lab Results  Component Value Date   TSH 4.26 04/30/2021   Lab Results  Component Value Date   WBC 5.4 04/30/2021   HGB 13.0 04/30/2021   HCT 40.8 04/30/2021   MCV 90.7 04/30/2021   PLT 243 04/30/2021   Lab Results  Component Value Date   NA 141 04/30/2021   K 4.4 04/30/2021   CO2 28 04/30/2021   GLUCOSE 95 04/30/2021   BUN 11 04/30/2021   CREATININE 0.88 04/30/2021   BILITOT 0.4 04/30/2021   AST 17 04/30/2021   ALT 10 04/30/2021   PROT 6.7 04/30/2021   CALCIUM 10.3 04/30/2021   EGFR 65 04/30/2021   Lab Results  Component Value Date   CHOL 153 04/30/2021   Lab Results  Component Value Date   HDL 61 04/30/2021   Lab Results  Component Value Date   LDLCALC 74 04/30/2021   Lab Results  Component Value Date   TRIG 92 04/30/2021   Lab Results  Component Value Date   CHOLHDL 2.5 04/30/2021   No results found for: HGBA1C    Assessment & Plan:   Problem List Items Addressed This Visit       Cardiovascular and Mediastinum   Paroxysmal atrial fibrillation ([place]) - Primary    Stable at the present time        Digestive   Gastroesophageal reflux disease without esophagitis    - The patient's GERD is stable on medication.  - Instructed the patient to avoid eating spicy and acidic foods, as well as foods high in fat. - Instructed the patient to avoid eating large meals or meals 2-3 hours prior to sleeping.        Nervous and Auditory   Bilateral carpal tunnel syndrome    Stable  Other   Anxiety     Keeping a stress/anxiety diary. This can help you learn what triggers your reaction and then learn ways to manage your response.  Thinking about how you react to certain situations. You may not be able to control everything, but you can control your response.  Making time for activities that help you relax and not feeling guilty about spending your time in this way.  Visual imagery and yoga can help you stay calm and relax.      Mixed hyperlipidemia    Patient was advised to follow low-cholesterol diet       No orders of the defined types were placed in this encounter.   Follow-up: No follow-ups on file.     1

## 2021-07-02 NOTE — Assessment & Plan Note (Signed)
Patient was advised to follow low-cholesterol diet

## 2021-07-12 ENCOUNTER — Ambulatory Visit (INDEPENDENT_AMBULATORY_CARE_PROVIDER_SITE_OTHER): Payer: Medicare Other | Admitting: *Deleted

## 2021-07-12 DIAGNOSIS — Z0000 Encounter for general adult medical examination without abnormal findings: Secondary | ICD-10-CM

## 2021-07-12 NOTE — Progress Notes (Signed)
Subjective:   Jessica Browning is a 87 y.o. female who presents for Medicare Annual (Subsequent) preventive examination.  I discussed the limitations of evaluation and management telemedicine and the availability of In person apts The patient expressed understanding and agreed to proceed.   Visit was performed using audio  Patient:home Provider: home   Review of Systems    Defer to provider  Cardiac Risk Factors include: none     Objective:    There were no vitals filed for this visit. There is no height or weight on file to calculate BMI.  Advanced Directives 07/12/2021  Does Patient Have a Medical Advance Directive? Yes  Type of Advance Directive Living will    Current Medications (verified) Outpatient Encounter Medications as of 07/12/2021  Medication Sig   ALPRAZolam (XANAX) 0.25 MG tablet Take 1 tablet (0.25 mg total) by mouth 2 (two) times daily.   amLODipine (NORVASC) 5 MG tablet TAKE 1 TABLET BY MOUTH EVERY DAY   aspirin EC 81 MG tablet Take 81 mg by mouth daily. Swallow whole.   atorvastatin (LIPITOR) 10 MG tablet TAKE 1 TABLET BY MOUTH EVERY DAY   CALCIUM 1000 + D 1000-800 MG-UNIT TABS Take 1 tablet by mouth daily.   EPINEPHrine 0.3 mg/0.3 mL IJ SOAJ injection Inject 0.3 mg into the muscle as needed for anaphylaxis.   No facility-administered encounter medications on file as of 07/12/2021.    Allergies (verified) Doxycycline and Tetanus toxoids   History: Past Medical History:  Diagnosis Date   Basal cell carcinoma 06/18/2016   Posterior neck. Nodular pattern.   Hypertension    Past Surgical History:  Procedure Laterality Date   ABDOMINAL HYSTERECTOMY     BLADDER SURGERY     VEIN LIGATION AND STRIPPING     bilateral legs   History reviewed. No pertinent family history. Social History   Socioeconomic History   Marital status: Single    Spouse name: Not on file   Number of children: Not on file   Years of education: Not on file   Highest  education level: Not on file  Occupational History   Not on file  Tobacco Use   Smoking status: Never   Smokeless tobacco: Never  Substance and Sexual Activity   Alcohol use: Never   Drug use: Never   Sexual activity: Not Currently  Other Topics Concern   Not on file  Social History Narrative   Not on file   Social Determinants of Health   Financial Resource Strain: Low Risk    Difficulty of Paying Living Expenses: Not very hard  Food Insecurity: No Food Insecurity   Worried About Running Out of Food in the Last Year: Never true   [place] in the Last Year: Never true  Transportation Needs: No Transportation Needs   Lack of Transportation (Medical): No   Lack of Transportation (Non-Medical): No  Physical Activity: Insufficiently Active   Days of Exercise per Week: 2 days   Minutes of Exercise per Session: 20 min  Stress: No Stress Concern Present   Feeling of Stress : Only a little  Social Connections: Moderately Integrated   Frequency of Communication with Friends and Family: More than three times a week   Frequency of Social Gatherings with Friends and Family: Three times a week   Attends Religious Services: 1 to 4 times per year   Active Member of Clubs or Organizations: Yes   Attends Archivist Meetings: 1 to 4 times  per year   Marital Status: Widowed    Tobacco Counseling Counseling given: Not Answered   Clinical Intake:  Pre-visit preparation completed: Yes  Pain : No/denies pain     Diabetes: No  How often do you need to have someone help you when you read instructions, pamphlets, or other written materials from your doctor or pharmacy?: 1 - Never What is the last grade level you completed in school?: 12  Diabetic?No  Interpreter Needed?: No  Information entered by :: Lacretia Nicks, [place]   Activities of Daily Living In your present state of health, do you have any difficulty performing the following activities: 07/12/2021   Hearing? Y  Vision? N  Difficulty concentrating or making decisions? N  Walking or climbing stairs? N  Dressing or bathing? N  Doing errands, shopping? N  Preparing Food and eating ? N  Using the Toilet? N  In the past six months, have you accidently leaked urine? N  Do you have problems with loss of bowel control? N  Managing your Medications? N  Managing your Finances? N  Housekeeping or managing your Housekeeping? N  Some recent data might be hidden    Patient Care Team: Cletis Athens, MD as PCP - General (Internal Medicine)  Indicate any recent Medical Services you may have received from other than [facility] providers in the past year (date may be approximate).     Assessment:   This is a routine wellness examination for Azyria.  Hearing/Vision screen No results found.  Dietary issues and exercise activities discussed: Current Exercise Habits: Home exercise routine, Type of exercise: walking, Time (Minutes): 20, Frequency (Times/Week): 2, Weekly Exercise (Minutes/Week): 40, Intensity: Mild, Exercise limited by: None identified   Goals Addressed   None    Depression Screen PHQ 2/9 Scores 07/12/2021 07/12/2021 04/30/2021 06/29/2020  PHQ - 2 Score 0 0 0 0    Fall Risk Fall Risk  07/12/2021 07/02/2021 04/30/2021 06/29/2020 09/10/2018  Falls in the past year? 1 - 1 1 0  Comment - - - - Emmi Telephone Survey: data to providers prior to load  Number falls in past yr: 0 0 0 0 -  Injury with Fall? 1 0 0 0 -  Risk for fall due to : History of fall(s) - History of fall(s) - -  Follow up Falls evaluation completed Falls evaluation completed - - -    FALL RISK PREVENTION PERTAINING TO THE HOME:  Any stairs in or around the home? No  If so, are there any without handrails? No  Home free of loose throw rugs in walkways, pet beds, electrical cords, etc? Yes  Adequate lighting in your home to reduce risk of falls? Yes   ASSISTIVE DEVICES UTILIZED TO PREVENT FALLS:  Life alert? No   Use of a cane, walker or w/c? No  Grab bars in the bathroom? No  Shower chair or bench in shower? No  Elevated toilet seat or a handicapped toilet? No   TIMED UP AND GO:  Was the test performed? No .  Length of time to ambulate: NA   Gait steady and fast without use of assistive device  Cognitive Function: MMSE - Mini Mental State Exam 07/12/2021  Orientation to time 5  Orientation to Place 5  Registration 3  Attention/ Calculation 5  Recall 3  Language- name 2 objects 2  Language- repeat 1  Language- follow 3 step command 3  Language- read & follow direction 1  Write a sentence 0  Copy design 0  Total score 28     6CIT Screen 07/12/2021  What Year? 0 points  What month? 0 points  What time? 0 points  Count back from 20 0 points  Months in reverse 0 points  Repeat phrase 0 points  Total Score 0    Immunizations Immunization History  Administered Date(s) Administered   Fluad Quad(high Dose 65+) 06/29/2020   Influenza, High Dose Seasonal PF 08/01/2015, 07/23/2016, 07/21/2018   Influenza-Unspecified 06/22/2015, 06/21/2018   Moderna Sars-Covid-2 Vaccination 11/03/2019, 12/01/2019, 07/18/2020, 08/21/2020   Pneumococcal Polysaccharide-23 07/21/2018   Tdap 12/28/2009   Zoster Recombinat (Shingrix) 12/01/2012    TDAP status: Due, Education has been provided regarding the importance of this vaccine. Advised may receive this vaccine at local pharmacy or Health Dept. Aware to provide a copy of the vaccination record if obtained from local pharmacy or Health Dept. Verbalized acceptance and understanding.  Flu Vaccine status: Up to date  Pneumococcal vaccine status: Up to date  Covid-19 vaccine status: Completed vaccines  Qualifies for Shingles Vaccine? Yes   Zostavax completed No   Shingrix Completed?: No.    Education has been provided regarding the importance of this vaccine. Patient has been advised to call insurance company to determine out of pocket expense if  they have not yet received this vaccine. Advised may also receive vaccine at local pharmacy or Health Dept. Verbalized acceptance and understanding.  Screening Tests Health Maintenance  Topic Date Due   DEXA SCAN  Never done   Zoster Vaccines- Shingrix (2 of 2) 01/26/2013   TETANUS/TDAP  12/29/2019   COVID-19 Vaccine (5 - Booster for Moderna series) 12/19/2020   INFLUENZA VACCINE  05/21/2021   HPV VACCINES  Aged Out    Health Maintenance  Health Maintenance Due  Topic Date Due   DEXA SCAN  Never done   Zoster Vaccines- Shingrix (2 of 2) 01/26/2013   TETANUS/TDAP  12/29/2019   COVID-19 Vaccine (5 - Booster for Moderna series) 12/19/2020   INFLUENZA VACCINE  05/21/2021    Colorectal cancer screening: Type of screening: Colonoscopy. Completed 2021. Repeat every 10 years  Mammogram status: No longer required due to Age.  Bone Density status: Ordered  . Pt provided with contact info and advised to call to schedule appt.  Lung Cancer Screening: (Low Dose CT Chest recommended if Age 79-80 years, 30 pack-year currently smoking OR have quit w/in 15years.) does not qualify.   Lung Cancer Screening Referral: NA  Additional Screening:  Hepatitis C Screening: does qualify; Completed No  Vision Screening: Recommended annual ophthalmology exams for early detection of glaucoma and other disorders of the eye. Is the patient up to date with their annual eye exam?  Yes  Who is the provider or what is the name of the office in which the patient attends annual eye exams? [facility] If pt is not established with a provider, would they like to be referred to a provider to establish care?  established .   Dental Screening: Recommended annual dental exams for proper oral hygiene  Community Resource Referral / Chronic Care Management: CRR required this visit?  No   CCM required this visit?  No      Plan:     I have personally reviewed and noted the following in the patient's chart:    Medical and social history Use of alcohol, tobacco or illicit drugs  Current medications and supplements including opioid prescriptions.  Functional ability and status Nutritional status Physical activity Advanced directives  List of other physicians Hospitalizations, surgeries, and ER visits in previous 12 months Vitals Screenings to include cognitive, depression, and falls Referrals and appointments  In addition, I have reviewed and discussed with patient certain preventive protocols, quality metrics, and best practice recommendations. A written personalized care plan for preventive services as well as general preventive health recommendations were provided to patient.     Lacretia Nicks, [place]   07/12/2021   Nurse Notes:  Ms. Lisle , Thank you for taking time to come for your Medicare Wellness Visit. I appreciate your ongoing commitment to your health goals. Please review the following plan we discussed and let me know if I can assist you in the future.   These are the goals we discussed:  Goals   None     This is a list of the screening recommended for you and due dates:  Health Maintenance  Topic Date Due   DEXA scan (bone density measurement)  Never done   Zoster (Shingles) Vaccine (2 of 2) 01/26/2013   Tetanus Vaccine  12/29/2019   COVID-19 Vaccine (5 - Booster for Moderna series) 12/19/2020   Flu Shot  05/21/2021   HPV Vaccine  Aged Out       Time spent with patient 40 min

## 2021-07-13 NOTE — Progress Notes (Signed)
I have reviewed this visit and agree with the documentation.   

## 2021-08-10 DIAGNOSIS — J3081 Allergic rhinitis due to animal (cat) (dog) hair and dander: Secondary | ICD-10-CM | POA: Diagnosis not present

## 2021-08-10 DIAGNOSIS — J3089 Other allergic rhinitis: Secondary | ICD-10-CM | POA: Diagnosis not present

## 2021-08-10 DIAGNOSIS — Z91018 Allergy to other foods: Secondary | ICD-10-CM | POA: Diagnosis not present

## 2021-08-16 ENCOUNTER — Other Ambulatory Visit: Payer: Self-pay | Admitting: Internal Medicine

## 2021-08-27 ENCOUNTER — Ambulatory Visit (INDEPENDENT_AMBULATORY_CARE_PROVIDER_SITE_OTHER): Payer: Medicare Other | Admitting: Internal Medicine

## 2021-08-27 ENCOUNTER — Other Ambulatory Visit: Payer: Self-pay

## 2021-08-27 VITALS — BP 156/79 | HR 62 | Ht 64.0 in | Wt 140.3 lb

## 2021-08-27 DIAGNOSIS — Z950 Presence of cardiac pacemaker: Secondary | ICD-10-CM | POA: Diagnosis not present

## 2021-08-27 DIAGNOSIS — K219 Gastro-esophageal reflux disease without esophagitis: Secondary | ICD-10-CM

## 2021-08-27 DIAGNOSIS — E782 Mixed hyperlipidemia: Secondary | ICD-10-CM | POA: Diagnosis not present

## 2021-08-27 DIAGNOSIS — I480 Paroxysmal atrial fibrillation: Secondary | ICD-10-CM | POA: Diagnosis not present

## 2021-09-02 ENCOUNTER — Encounter: Payer: Self-pay | Admitting: Internal Medicine

## 2021-09-02 NOTE — Assessment & Plan Note (Signed)
Pacemaker is working well 

## 2021-09-02 NOTE — Assessment & Plan Note (Signed)
Hypercholesterolemia  I advised the patient to follow Mediterranean diet This diet is rich in fruits vegetables and whole grain, and This diet is also rich in fish and lean meat Patient should also eat a handful of almonds or walnuts daily Recent heart study indicated that average follow-up on this kind of diet reduces the cardiovascular mortality by 50 to 70%== 

## 2021-09-02 NOTE — Assessment & Plan Note (Signed)
Atrial fibrillation is stable today

## 2021-09-02 NOTE — Assessment & Plan Note (Signed)
-   The patient's GERD is stable on medication.  - Instructed the patient to avoid eating spicy and acidic foods, as well as foods high in fat. - Instructed the patient to avoid eating large meals or meals 2-3 hours prior to sleeping. 

## 2021-09-02 NOTE — Progress Notes (Signed)
Jessica Browning  Established Patient Office Visit  Subjective:  Patient ID: Jessica Browning, female    DOB: December 12, 1933  Age: 87 y.o. MRN: [id number]  CC:  Chief Complaint  Patient presents with   Pacemaker Check    HPI  Jessica Browning presents for for general checkup and pacemaker check, patient denies any chest pain shortness of breath palpitation swelling of the legs.  Past Medical History:  Diagnosis Date   Basal cell carcinoma 06/18/2016   Posterior neck. Nodular pattern.   Hypertension     Past Surgical History:  Procedure Laterality Date   ABDOMINAL HYSTERECTOMY     BLADDER SURGERY     VEIN LIGATION AND STRIPPING     bilateral legs    History reviewed. No pertinent family history.  Social History   Socioeconomic History   Marital status: Single    Spouse name: Not on file   Number of children: Not on file   Years of education: Not on file   Highest education Browning: Not on file  Occupational History   Not on file  Tobacco Use   Smoking status: Never   Smokeless tobacco: Never  Substance and Sexual Activity   Alcohol use: Never   Drug use: Never   Sexual activity: Not Currently  Other Topics Concern   Not on file  Social History Narrative   Not on file   Social Determinants of Health   Financial Resource Strain: Low Risk    Difficulty of Paying Living Expenses: Not very hard  Food Insecurity: No Food Insecurity   Worried About Running Out of Food in the Last Year: Never true   [place] in the Last Year: Never true  Transportation Needs: No Transportation Needs   Lack of Transportation (Medical): No   Lack of Transportation (Non-Medical): No  Physical Activity: Insufficiently Active   Days of Exercise per Week: 2 days   Minutes of Exercise per Session: 20 min  Stress: No Stress Concern Present   Feeling of Stress : Only a little  Social Connections: Moderately Integrated   Frequency of Communication with Friends and Family: More than three  times a week   Frequency of Social Gatherings with Friends and Family: Three times a week   Attends Religious Services: 1 to 4 times per year   Active Member of Clubs or Organizations: Yes   Attends Archivist Meetings: 1 to 4 times per year   Marital Status: Widowed  Human resources officer Violence: Not At Risk   Fear of Current or Ex-Partner: No   Emotionally Abused: No   Physically Abused: No   Sexually Abused: No     Current Outpatient Medications:    ALPRAZolam (XANAX) 0.25 MG tablet, Take 1 tablet (0.25 mg total) by mouth 2 (two) times daily., Disp: 60 tablet, Rfl: 0   amLODipine (NORVASC) 5 MG tablet, TAKE 1 TABLET BY MOUTH EVERY DAY, Disp: 90 tablet, Rfl: 2   aspirin EC 81 MG tablet, Take 81 mg by mouth daily. Swallow whole., Disp: , Rfl:    atorvastatin (LIPITOR) 10 MG tablet, TAKE 1 TABLET BY MOUTH EVERY DAY, Disp: 90 tablet, Rfl: 3   CALCIUM 1000 + D 1000-800 MG-UNIT TABS, Take 1 tablet by mouth daily., Disp: 30 tablet, Rfl: 6   EPINEPHrine 0.3 mg/0.3 mL IJ SOAJ injection, Inject 0.3 mg into the muscle as needed for anaphylaxis., Disp: , Rfl:    Allergies  Allergen Reactions   Doxycycline Nausea And Vomiting  Tetanus Toxoids Hives    ROS Review of Systems  Constitutional: Negative.   HENT: Negative.    Eyes: Negative.   Respiratory: Negative.    Cardiovascular: Negative.   Gastrointestinal: Negative.   Endocrine: Negative.   Genitourinary: Negative.   Musculoskeletal: Negative.   Skin: Negative.   Allergic/Immunologic: Negative.   Neurological: Negative.   Hematological: Negative.   Psychiatric/Behavioral: Negative.    All other systems reviewed and are negative.    Objective:    Physical Exam Vitals reviewed.  Constitutional:      Appearance: Normal appearance.  HENT:     Mouth/Throat:     Mouth: Mucous membranes are moist.  Eyes:     Pupils: Pupils are equal, round, and reactive to light.  Neck:     Vascular: No carotid bruit.   Cardiovascular:     Rate and Rhythm: Normal rate and regular rhythm.     Pulses: Normal pulses.     Heart sounds: Normal heart sounds.  Pulmonary:     Effort: Pulmonary effort is normal.     Breath sounds: Normal breath sounds.  Abdominal:     General: Bowel sounds are normal.     Palpations: Abdomen is soft. There is no hepatomegaly, splenomegaly or mass.     Tenderness: There is no abdominal tenderness.     Hernia: No hernia is present.  Musculoskeletal:        General: No tenderness.     Cervical back: Neck supple.     Right lower leg: No edema.     Left lower leg: No edema.  Skin:    Findings: No rash.  Neurological:     Mental Status: She is alert and oriented to person, place, and time.     Motor: No weakness.  Psychiatric:        Mood and Affect: Mood and affect normal.        Behavior: Behavior normal.    BP (!) 156/79   Pulse 62   Ht 5' 4"  (1.626 m)   Wt 140 lb 4.8 oz (63.6 kg)   BMI 24.08 kg/m  Wt Readings from Last 3 Encounters:  08/27/21 140 lb 4.8 oz (63.6 kg)  07/02/21 142 lb 9.6 oz (64.7 kg)  05/04/21 141 lb (64 kg)     Health Maintenance Due  Topic Date Due   DEXA SCAN  Never done   Zoster Vaccines- Shingrix (2 of 2) 01/26/2013   Pneumonia Vaccine 66+ Years old (2 - PCV) 07/22/2019   TETANUS/TDAP  12/29/2019   COVID-19 Vaccine (5 - Booster for Moderna series) 10/16/2020    There are no preventive care reminders to display for this patient.  Lab Results  Component Value Date   TSH 4.26 04/30/2021   Lab Results  Component Value Date   WBC 5.4 04/30/2021   HGB 13.0 04/30/2021   HCT 40.8 04/30/2021   MCV 90.7 04/30/2021   PLT 243 04/30/2021   Lab Results  Component Value Date   NA 141 04/30/2021   K 4.4 04/30/2021   CO2 28 04/30/2021   GLUCOSE 95 04/30/2021   BUN 11 04/30/2021   CREATININE 0.88 04/30/2021   BILITOT 0.4 04/30/2021   AST 17 04/30/2021   ALT 10 04/30/2021   PROT 6.7 04/30/2021   CALCIUM 10.3 04/30/2021   EGFR 65  04/30/2021   Lab Results  Component Value Date   CHOL 153 04/30/2021   Lab Results  Component Value Date   HDL 61 04/30/2021  Lab Results  Component Value Date   LDLCALC 74 04/30/2021   Lab Results  Component Value Date   TRIG 92 04/30/2021   Lab Results  Component Value Date   CHOLHDL 2.5 04/30/2021   No results found for: HGBA1C    Assessment & Plan:   Problem List Items Addressed This Visit       Cardiovascular and Mediastinum   Paroxysmal atrial fibrillation (HCC)    Atrial fibrillation is stable today        Digestive   Gastroesophageal reflux disease without esophagitis    - The patient's GERD is stable on medication.  - Instructed the patient to avoid eating spicy and acidic foods, as well as foods high in fat. - Instructed the patient to avoid eating large meals or meals 2-3 hours prior to sleeping.        Other   Cardiac pacemaker in situ - Primary    Pacemaker is working well      Relevant Orders   PACEMAKER IN CLINIC CHECK   Mixed hyperlipidemia    Hypercholesterolemia  I advised the patient to follow Mediterranean diet This diet is rich in fruits vegetables and whole grain, and This diet is also rich in fish and lean meat Patient should also eat a handful of almonds or walnuts daily Recent heart study indicated that average follow-up on this kind of diet reduces the cardiovascular mortality by 50 to 70%==     Note: Medical Device Follow-up  Patient pacemaker was interrogated by pacemakers analyzer, battery status is okay.  No programming changes were indicated after the review of the data.  Histogram shows no change since the last interrogation Atrial and ventricular sensing thresholds were found to be acceptable Impedance was checked and it was found to be normal.  Thresholds were found to be okay on evaluation of rhythm problem.  No high rate or low rate arrhythmia were noted.  Estimated battery longevity is 16 months. I have personally  reviewed the device data and amended the report as necessary.    No orders of the defined types were placed in this encounter.   Follow-up: No follow-ups on file.    Cletis Athens, MD

## 2021-10-19 IMAGING — CR DG CHEST 2V
1 series · 2 of 2 positions shown · non-contrast
Comparison: 05/31/2013

CLINICAL DATA: Left-sided chest pain

EXAM:
CHEST - 2 VIEW

[Series 1: w chest pa · 0.14mm/px · 2 of 2 slices shown]
[im 1/2]
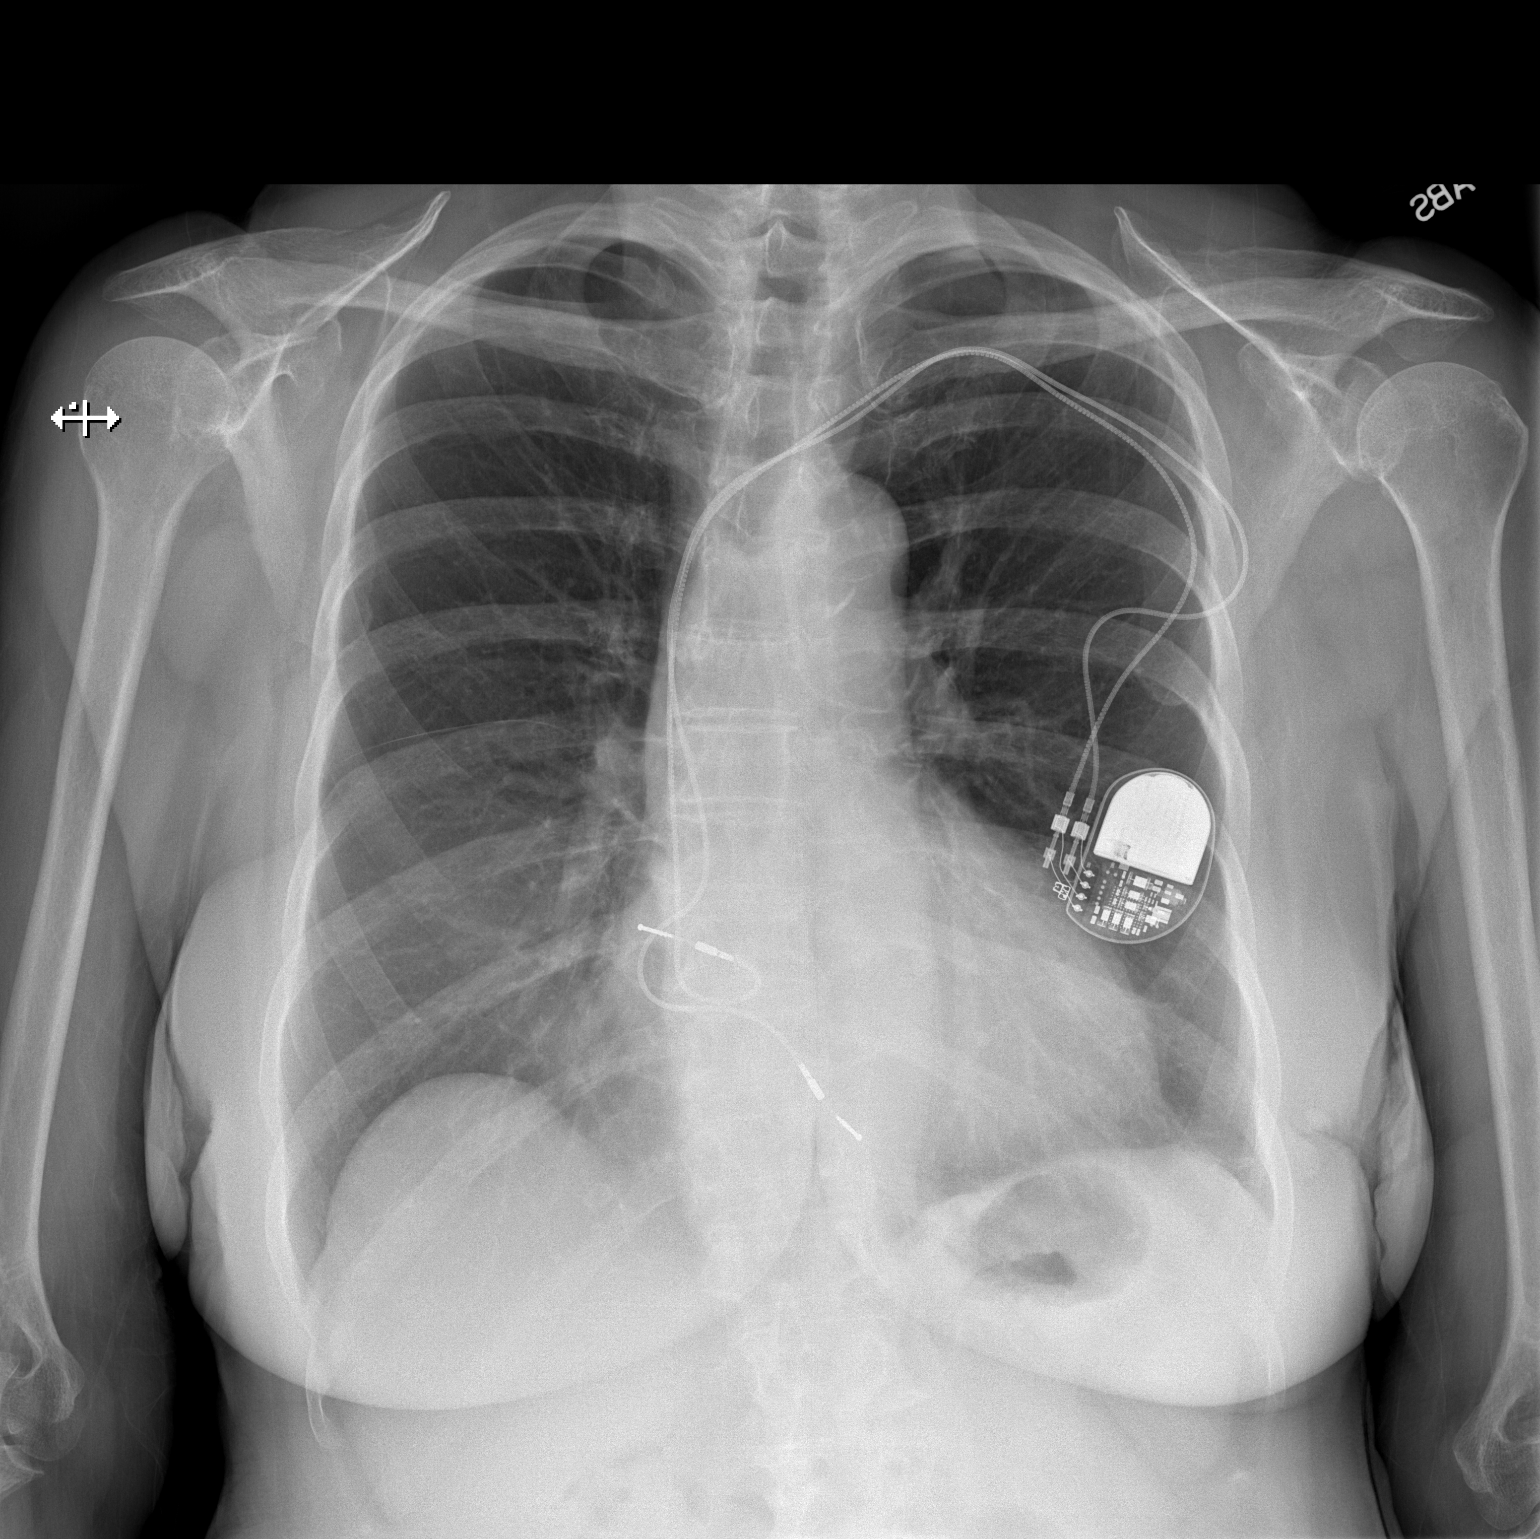
[im 2/2]
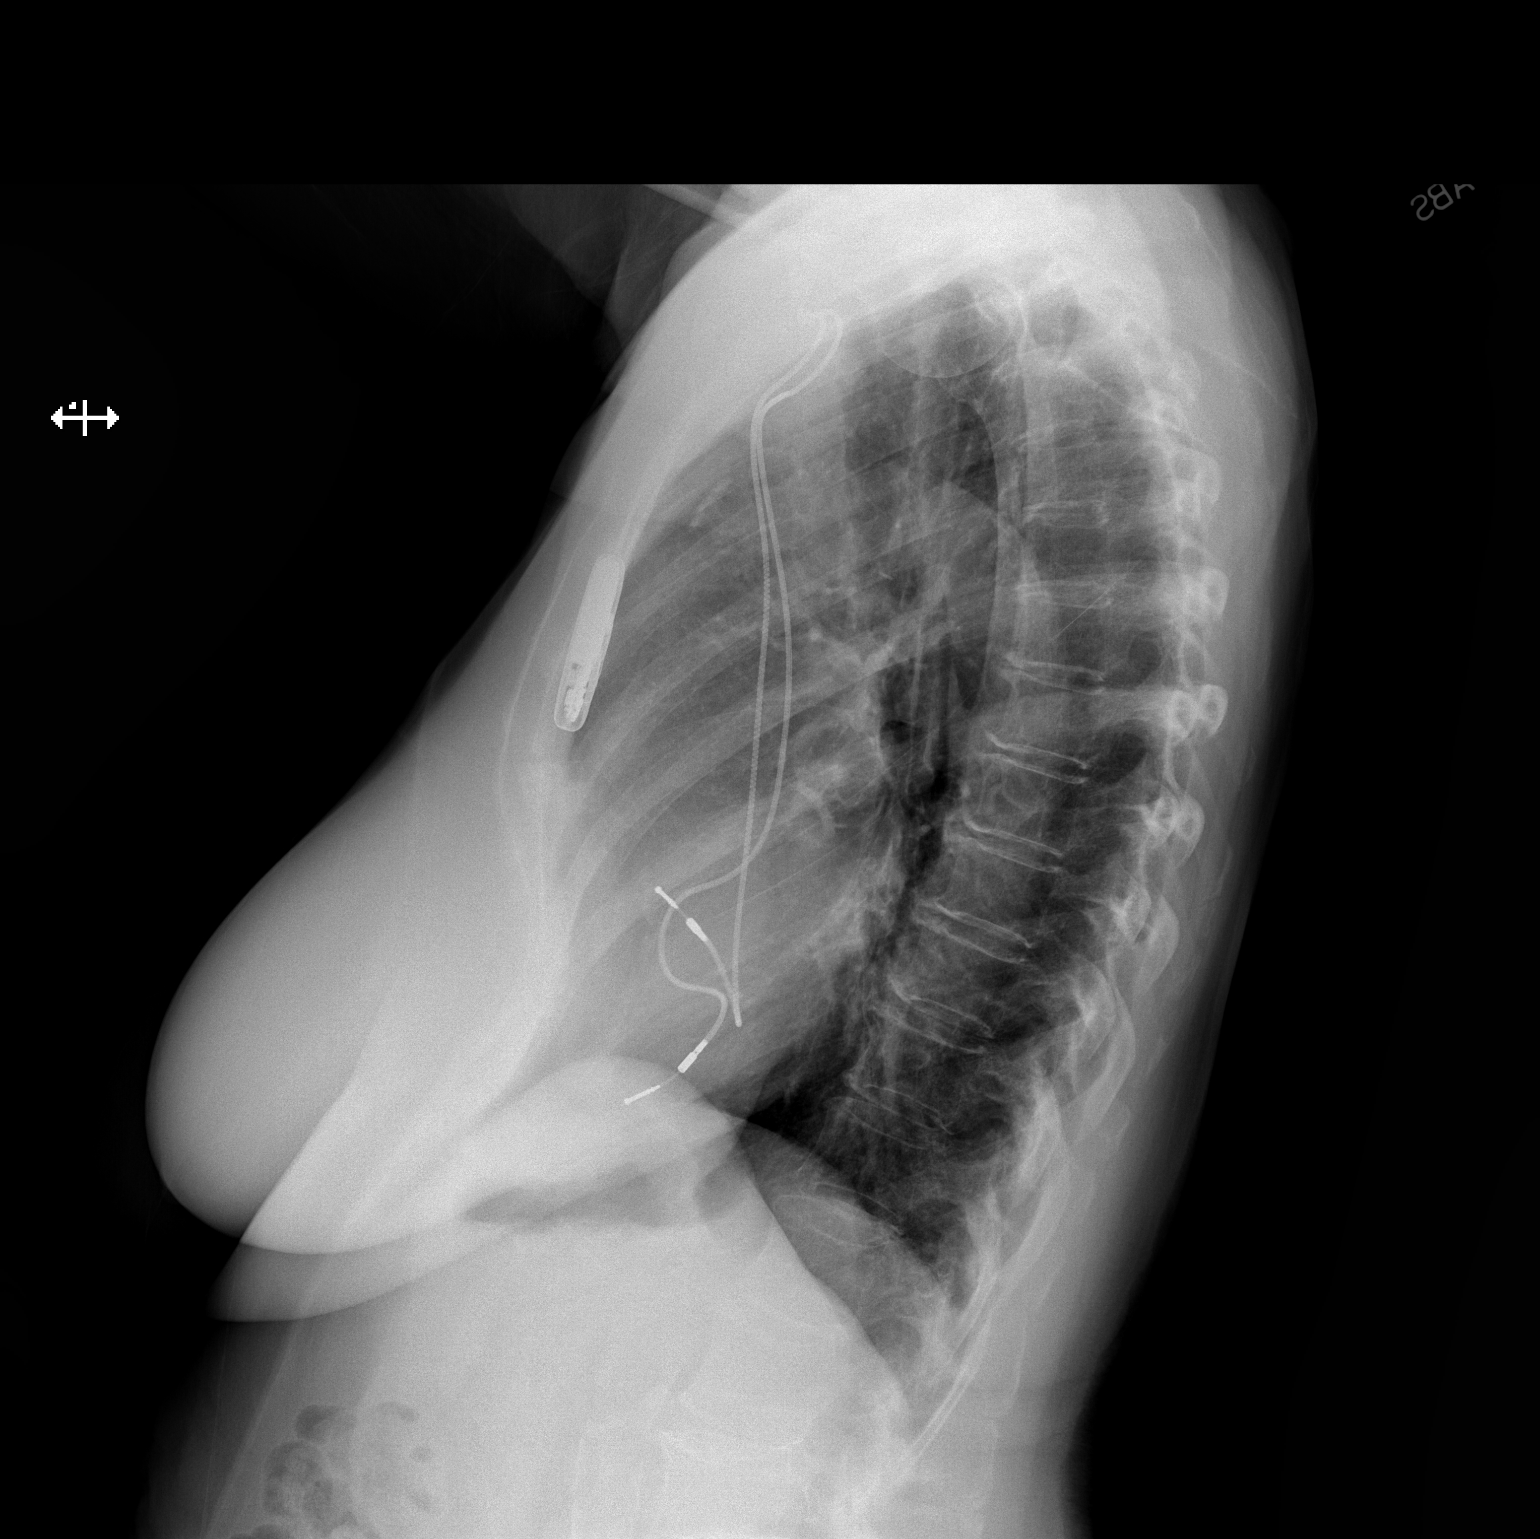

[2 of 2 positions shown; findings below may reference images not displayed]

FINDINGS: There is no focal consolidation. There is no pleural effusion or
pneumothorax. The heart and mediastinal contours are unremarkable.
There is a dual lead cardiac pacemaker.

There is no acute osseous abnormality.
IMPRESSION: No active cardiopulmonary disease.

## 2021-10-23 ENCOUNTER — Other Ambulatory Visit: Payer: Self-pay | Admitting: *Deleted

## 2021-10-23 ENCOUNTER — Other Ambulatory Visit: Payer: Self-pay | Admitting: Internal Medicine

## 2021-10-23 MED ORDER — NITROFURANTOIN MACROCRYSTAL 100 MG PO CAPS: 100.0000 mg | ORAL_CAPSULE | Freq: Two times a day (BID) | ORAL | 1 refills | Status: DC

## 2021-11-22 DIAGNOSIS — H2513 Age-related nuclear cataract, bilateral: Secondary | ICD-10-CM | POA: Diagnosis not present

## 2021-11-22 DIAGNOSIS — H524 Presbyopia: Secondary | ICD-10-CM | POA: Diagnosis not present

## 2021-11-26 ENCOUNTER — Other Ambulatory Visit: Payer: Self-pay

## 2021-11-26 ENCOUNTER — Ambulatory Visit (INDEPENDENT_AMBULATORY_CARE_PROVIDER_SITE_OTHER): Payer: Medicare Other | Admitting: Internal Medicine

## 2021-11-26 VITALS — BP 139/81 | HR 88

## 2021-11-26 DIAGNOSIS — Z950 Presence of cardiac pacemaker: Secondary | ICD-10-CM | POA: Diagnosis not present

## 2021-11-26 DIAGNOSIS — K219 Gastro-esophageal reflux disease without esophagitis: Secondary | ICD-10-CM

## 2021-11-26 DIAGNOSIS — I480 Paroxysmal atrial fibrillation: Secondary | ICD-10-CM | POA: Diagnosis not present

## 2021-11-26 DIAGNOSIS — E782 Mixed hyperlipidemia: Secondary | ICD-10-CM | POA: Diagnosis not present

## 2021-11-26 NOTE — Progress Notes (Signed)
Established Patient Office Visit  Subjective:  Patient ID: Jessica Browning, female    DOB: [birth year]  Age: 88 y.o. MRN: [id number]  CC:  Chief Complaint  Patient presents with   Pacemaker Check    HPI  Jessica Browning presents for check up  Past Medical History:  Diagnosis Date   Basal cell carcinoma 06/18/2016   Posterior neck. Nodular pattern.   Hypertension     Past Surgical History:  Procedure Laterality Date   ABDOMINAL HYSTERECTOMY     BLADDER SURGERY     VEIN LIGATION AND STRIPPING     bilateral legs    History reviewed. No pertinent family history.  Social History   Socioeconomic History   Marital status: Single    Spouse name: Not on file   Number of children: Not on file   Years of education: Not on file   Highest education level: Not on file  Occupational History   Not on file  Tobacco Use   Smoking status: Never   Smokeless tobacco: Never  Substance and Sexual Activity   Alcohol use: Never   Drug use: Never   Sexual activity: Not Currently  Other Topics Concern   Not on file  Social History Narrative   Not on file   Social Determinants of Health   Financial Resource Strain: Low Risk    Difficulty of Paying Living Expenses: Not very hard  Food Insecurity: No Food Insecurity   Worried About Running Out of Food in the Last Year: Never true   [place] in the Last Year: Never true  Transportation Needs: No Transportation Needs   Lack of Transportation (Medical): No   Lack of Transportation (Non-Medical): No  Physical Activity: Insufficiently Active   Days of Exercise per Week: 2 days   Minutes of Exercise per Session: 20 min  Stress: No Stress Concern Present   Feeling of Stress : Only a little  Social Connections: Moderately Integrated   Frequency of Communication with Friends and Family: More than three times a week   Frequency of Social Gatherings with Friends and Family: Three times a week   Attends Religious Services:  1 to 4 times per year   Active Member of Clubs or Organizations: Yes   Attends Archivist Meetings: 1 to 4 times per year   Marital Status: Widowed  Human resources officer Violence: Not At Risk   Fear of Current or Ex-Partner: No   Emotionally Abused: No   Physically Abused: No   Sexually Abused: No     Current Outpatient Medications:    ALPRAZolam (XANAX) 0.25 MG tablet, Take 1 tablet (0.25 mg total) by mouth 2 (two) times daily., Disp: 60 tablet, Rfl: 0   amLODipine (NORVASC) 5 MG tablet, TAKE 1 TABLET BY MOUTH EVERY DAY, Disp: 90 tablet, Rfl: 2   aspirin EC 81 MG tablet, Take 81 mg by mouth daily. Swallow whole., Disp: , Rfl:    atorvastatin (LIPITOR) 10 MG tablet, TAKE 1 TABLET BY MOUTH EVERY DAY, Disp: 90 tablet, Rfl: 3   CALCIUM 1000 + D 1000-800 MG-UNIT TABS, Take 1 tablet by mouth daily., Disp: 30 tablet, Rfl: 6   EPINEPHrine 0.3 mg/0.3 mL IJ SOAJ injection, Inject 0.3 mg into the muscle as needed for anaphylaxis., Disp: , Rfl:    Allergies  Allergen Reactions   Doxycycline Nausea And Vomiting   Tetanus Toxoids Hives    ROS Review of Systems  Constitutional: Negative.   HENT: Negative.  Eyes: Negative.   Respiratory: Negative.    Cardiovascular: Negative.   Gastrointestinal: Negative.   Endocrine: Negative.   Genitourinary: Negative.   Musculoskeletal: Negative.   Skin: Negative.   Allergic/Immunologic: Negative.   Neurological: Negative.   Hematological: Negative.   Psychiatric/Behavioral: Negative.    All other systems reviewed and are negative.    Objective:    Physical Exam Vitals reviewed.  Constitutional:      Appearance: Normal appearance.  HENT:     Mouth/Throat:     Mouth: Mucous membranes are moist.  Eyes:     Pupils: Pupils are equal, round, and reactive to light.  Neck:     Vascular: No carotid bruit.  Cardiovascular:     Rate and Rhythm: Normal rate and regular rhythm.     Pulses: Normal pulses.     Heart sounds: Normal heart  sounds.  Pulmonary:     Effort: Pulmonary effort is normal.     Breath sounds: Normal breath sounds.  Abdominal:     General: Bowel sounds are normal.     Palpations: Abdomen is soft. There is no hepatomegaly, splenomegaly or mass.     Tenderness: There is no abdominal tenderness.     Hernia: No hernia is present.  Musculoskeletal:        General: No tenderness.     Cervical back: Neck supple.     Right lower leg: No edema.     Left lower leg: No edema.  Skin:    Findings: No rash.  Neurological:     Mental Status: She is alert and oriented to person, place, and time.     Motor: No weakness.  Psychiatric:        Mood and Affect: Mood and affect normal.        Behavior: Behavior normal.    BP 139/81    Pulse 88  Wt Readings from Last 3 Encounters:  08/27/21 140 lb 4.8 oz (63.6 kg)  07/02/21 142 lb 9.6 oz (64.7 kg)  05/04/21 141 lb (64 kg)     Health Maintenance Due  Topic Date Due   DEXA SCAN  Never done   Zoster Vaccines- Shingrix (2 of 2) 01/26/2013   Pneumonia Vaccine 44+ Years old (2 - PCV) 07/22/2019   TETANUS/TDAP  12/29/2019   COVID-19 Vaccine (5 - Booster for Moderna series) 10/16/2020    There are no preventive care reminders to display for this patient.  Lab Results  Component Value Date   TSH 4.26 04/30/2021   Lab Results  Component Value Date   WBC 5.4 04/30/2021   HGB 13.0 04/30/2021   HCT 40.8 04/30/2021   MCV 90.7 04/30/2021   PLT 243 04/30/2021   Lab Results  Component Value Date   NA 141 04/30/2021   K 4.4 04/30/2021   CO2 28 04/30/2021   GLUCOSE 95 04/30/2021   BUN 11 04/30/2021   CREATININE 0.88 04/30/2021   BILITOT 0.4 04/30/2021   AST 17 04/30/2021   ALT 10 04/30/2021   PROT 6.7 04/30/2021   CALCIUM 10.3 04/30/2021   EGFR 65 04/30/2021   Lab Results  Component Value Date   CHOL 153 04/30/2021   Lab Results  Component Value Date   HDL 61 04/30/2021   Lab Results  Component Value Date   LDLCALC 74 04/30/2021   Lab  Results  Component Value Date   TRIG 92 04/30/2021   Lab Results  Component Value Date   CHOLHDL 2.5 04/30/2021  No results found for: HGBA1C    Assessment & Plan:   Problem List Items Addressed This Visit       Cardiovascular and Mediastinum   Paroxysmal atrial fibrillation (HCC)     Patient is stable at the present time Patient is not a candidate for Coumadin        Digestive   Gastroesophageal reflux disease without esophagitis    Counseling  If a person has gastroesophageal reflux disease (GERD), food and stomach acid move back up into the esophagus and cause symptoms or problems such as damage to the esophagus.  Anti-reflux measures include: raising the head of the bed, avoiding tight clothing or belts, avoiding eating late at night, not lying down shortly after mealtime, and achieving weight loss.  Avoid ASA, NSAID's, caffeine, alcohol, and tobacco.   OTC Pepcid and/or Tums are often very helpful for as needed use.   However, for persisting chronic or daily symptoms, stronger medications like Omeprazole may be needed.  You may need to avoid foods and drinks such as: ? Coffee and tea (with or without caffeine). ? Drinks that contain alcohol. ? Energy drinks and sports drinks. ? Bubbly (carbonated) drinks or sodas. ? Chocolate and cocoa. ? Peppermint and mint flavorings. ? Garlic and onions. ? Horseradish. ? Spicy and acidic foods. These include peppers, chili powder, curry powder, vinegar, hot sauces, and BBQ sauce. ? Citrus fruit juices and citrus fruits, such as oranges, lemons, and limes. ? Tomato-based foods. These include red sauce, chili, salsa, and pizza with red sauce. ? Fried and fatty foods. These include donuts, french fries, potato chips, and high-fat dressings. ? High-fat meats. These include hot dogs, rib eye steak, sausage, ham, and bacon.         Other   Cardiac pacemaker in situ - Primary    Battery life of the pacemaker is 12 months       Relevant Orders   PACEMAKER IN CLINIC CHECK   Mixed hyperlipidemia    Hypercholesterolemia  I advised the patient to follow Mediterranean diet This diet is rich in fruits vegetables and whole grain, and This diet is also rich in fish and lean meat Patient should also eat a handful of almonds or walnuts daily Recent heart study indicated that average follow-up on this kind of diet reduces the cardiovascular mortality by 50 to 70%==      Note: Medical Device Follow-up  Patient pacemaker was interrogated by pacemakers analyzer, battery status is okay.  No programming changes were indicated after the review of the data.  Histogram shows no change since the last interrogation Atrial and ventricular sensing thresholds were found to be acceptable Impedance was checked and it was found to be normal.  Thresholds were found to be okay on evaluation of rhythm problem.  No high rate or low rate arrhythmia were noted.  Estimated battery longevity is 12 months. I have personally reviewed the device data and amended the report as necessary.   No orders of the defined types were placed in this encounter.   Follow-up: No follow-ups on file.    Cletis Athens, MD

## 2021-11-26 NOTE — Assessment & Plan Note (Signed)
Battery life of the pacemaker is 12 months

## 2021-11-26 NOTE — Assessment & Plan Note (Signed)
Hypercholesterolemia  I advised the patient to follow Mediterranean diet This diet is rich in fruits vegetables and whole grain, and This diet is also rich in fish and lean meat Patient should also eat a handful of almonds or walnuts daily Recent heart study indicated that average follow-up on this kind of diet reduces the cardiovascular mortality by 50 to 70%== 

## 2021-11-26 NOTE — Assessment & Plan Note (Addendum)
°  Patient is stable at the present time Patient is not a candidate for Coumadin

## 2021-11-26 NOTE — Assessment & Plan Note (Signed)

## 2021-12-04 DIAGNOSIS — H25811 Combined forms of age-related cataract, right eye: Secondary | ICD-10-CM | POA: Diagnosis not present

## 2021-12-04 DIAGNOSIS — Z01818 Encounter for other preprocedural examination: Secondary | ICD-10-CM | POA: Diagnosis not present

## 2021-12-04 DIAGNOSIS — H25812 Combined forms of age-related cataract, left eye: Secondary | ICD-10-CM | POA: Diagnosis not present

## 2021-12-13 DIAGNOSIS — H25812 Combined forms of age-related cataract, left eye: Secondary | ICD-10-CM | POA: Diagnosis not present

## 2021-12-17 ENCOUNTER — Other Ambulatory Visit: Payer: Self-pay

## 2021-12-17 ENCOUNTER — Encounter: Payer: Self-pay | Admitting: Internal Medicine

## 2021-12-17 ENCOUNTER — Ambulatory Visit (INDEPENDENT_AMBULATORY_CARE_PROVIDER_SITE_OTHER): Payer: Medicare Other | Admitting: Internal Medicine

## 2021-12-17 VITALS — BP 166/87 | HR 70 | Ht 64.0 in | Wt 142.5 lb

## 2021-12-17 DIAGNOSIS — G5603 Carpal tunnel syndrome, bilateral upper limbs: Secondary | ICD-10-CM | POA: Diagnosis not present

## 2021-12-17 DIAGNOSIS — Z950 Presence of cardiac pacemaker: Secondary | ICD-10-CM

## 2021-12-17 DIAGNOSIS — F419 Anxiety disorder, unspecified: Secondary | ICD-10-CM

## 2021-12-17 DIAGNOSIS — I480 Paroxysmal atrial fibrillation: Secondary | ICD-10-CM | POA: Diagnosis not present

## 2021-12-17 DIAGNOSIS — K219 Gastro-esophageal reflux disease without esophagitis: Secondary | ICD-10-CM | POA: Diagnosis not present

## 2021-12-17 MED ORDER — ALPRAZOLAM 0.25 MG PO TABS: 0.2500 mg | ORAL_TABLET | Freq: Two times a day (BID) | ORAL | 0 refills | Status: DC

## 2021-12-17 NOTE — Assessment & Plan Note (Signed)
Pacemaker working well.

## 2021-12-17 NOTE — Assessment & Plan Note (Signed)
-   Patient experiencing high levels of anxiety.  - Encouraged patient to engage in relaxing activities like yoga, meditation, journaling, going for a walk, or participating in a hobby.  - Encouraged patient to reach out to trusted friends or family members about recent struggles, Patient was advised to read A book, how to stop worrying and start living, it is good book to read to control  the stress  

## 2021-12-17 NOTE — Assessment & Plan Note (Signed)
Stable at the present time. 

## 2021-12-17 NOTE — Assessment & Plan Note (Signed)

## 2021-12-17 NOTE — Progress Notes (Signed)
Established Patient Office Visit  Subjective:  Patient ID: Jessica Browning, female    DOB: [birth year]  Age: 88 y.o. MRN: [id number]  CC:  Chief Complaint  Patient presents with   Medication Refill    Medication Refill   Jessica Browning presents forcheck up  Past Medical History:  Diagnosis Date   Basal cell carcinoma 06/18/2016   Posterior neck. Nodular pattern.   Hypertension     Past Surgical History:  Procedure Laterality Date   ABDOMINAL HYSTERECTOMY     BLADDER SURGERY     VEIN LIGATION AND STRIPPING     bilateral legs    History reviewed. No pertinent family history.  Social History   Socioeconomic History   Marital status: Single    Spouse name: Not on file   Number of children: Not on file   Years of education: Not on file   Highest education level: Not on file  Occupational History   Not on file  Tobacco Use   Smoking status: Never   Smokeless tobacco: Never  Substance and Sexual Activity   Alcohol use: Never   Drug use: Never   Sexual activity: Not Currently  Other Topics Concern   Not on file  Social History Narrative   Not on file   Social Determinants of Health   Financial Resource Strain: Low Risk    Difficulty of Paying Living Expenses: Not very hard  Food Insecurity: No Food Insecurity   Worried About Running Out of Food in the Last Year: Never true   [place] in the Last Year: Never true  Transportation Needs: No Transportation Needs   Lack of Transportation (Medical): No   Lack of Transportation (Non-Medical): No  Physical Activity: Insufficiently Active   Days of Exercise per Week: 2 days   Minutes of Exercise per Session: 20 min  Stress: No Stress Concern Present   Feeling of Stress : Only a little  Social Connections: Moderately Integrated   Frequency of Communication with Friends and Family: More than three times a week   Frequency of Social Gatherings with Friends and Family: Three times a week   Attends  Religious Services: 1 to 4 times per year   Active Member of Clubs or Organizations: Yes   Attends Archivist Meetings: 1 to 4 times per year   Marital Status: Widowed  Human resources officer Violence: Not At Risk   Fear of Current or Ex-Partner: No   Emotionally Abused: No   Physically Abused: No   Sexually Abused: No     Current Outpatient Medications:    amLODipine (NORVASC) 5 MG tablet, TAKE 1 TABLET BY MOUTH EVERY DAY, Disp: 90 tablet, Rfl: 2   aspirin EC 81 MG tablet, Take 81 mg by mouth daily. Swallow whole., Disp: , Rfl:    atorvastatin (LIPITOR) 10 MG tablet, TAKE 1 TABLET BY MOUTH EVERY DAY, Disp: 90 tablet, Rfl: 3   CALCIUM 1000 + D 1000-800 MG-UNIT TABS, Take 1 tablet by mouth daily., Disp: 30 tablet, Rfl: 6   EPINEPHrine 0.3 mg/0.3 mL IJ SOAJ injection, Inject 0.3 mg into the muscle as needed for anaphylaxis., Disp: , Rfl:    ALPRAZolam (XANAX) 0.25 MG tablet, Take 1 tablet (0.25 mg total) by mouth 2 (two) times daily., Disp: 60 tablet, Rfl: 0   Allergies  Allergen Reactions   Doxycycline Nausea And Vomiting   Tetanus Toxoids Hives    ROS Review of Systems  Constitutional: Negative.   HENT:  Negative.    Eyes: Negative.   Respiratory: Negative.    Cardiovascular: Negative.   Gastrointestinal: Negative.   Endocrine: Negative.   Genitourinary: Negative.   Musculoskeletal: Negative.   Skin: Negative.   Allergic/Immunologic: Negative.   Neurological: Negative.   Hematological: Negative.   Psychiatric/Behavioral: Negative.    All other systems reviewed and are negative.    Objective:    Physical Exam Vitals reviewed.  Constitutional:      Appearance: Normal appearance.  HENT:     Mouth/Throat:     Mouth: Mucous membranes are moist.  Eyes:     Pupils: Pupils are equal, round, and reactive to light.  Neck:     Vascular: No carotid bruit.  Cardiovascular:     Rate and Rhythm: Normal rate and regular rhythm.     Pulses: Normal pulses.     Heart  sounds: Normal heart sounds.  Pulmonary:     Effort: Pulmonary effort is normal.     Breath sounds: Normal breath sounds.  Abdominal:     General: Bowel sounds are normal.     Palpations: Abdomen is soft. There is no hepatomegaly, splenomegaly or mass.     Tenderness: There is no abdominal tenderness.     Hernia: No hernia is present.  Musculoskeletal:        General: No tenderness.     Cervical back: Neck supple.     Right lower leg: No edema.     Left lower leg: No edema.  Skin:    Findings: No rash.  Neurological:     Mental Status: She is alert and oriented to person, place, and time.     Motor: No weakness.  Psychiatric:        Mood and Affect: Mood and affect normal.        Behavior: Behavior normal.  Systolic murmur 2 x 6  BP (!) 166/87    Pulse 70    Ht $R'5\' 4"'LM$  (1.626 m)    Wt 142 lb 8 oz (64.6 kg)    BMI 24.46 kg/m  Wt Readings from Last 3 Encounters:  12/17/21 142 lb 8 oz (64.6 kg)  08/27/21 140 lb 4.8 oz (63.6 kg)  07/02/21 142 lb 9.6 oz (64.7 kg)     Health Maintenance Due  Topic Date Due   DEXA SCAN  Never done   Zoster Vaccines- Shingrix (2 of 2) 01/26/2013   Pneumonia Vaccine 4+ Years old (2 - PCV) 07/22/2019   TETANUS/TDAP  12/29/2019   COVID-19 Vaccine (5 - Booster for Moderna series) 10/16/2020    There are no preventive care reminders to display for this patient.  Lab Results  Component Value Date   TSH 4.26 04/30/2021   Lab Results  Component Value Date   WBC 5.4 04/30/2021   HGB 13.0 04/30/2021   HCT 40.8 04/30/2021   MCV 90.7 04/30/2021   PLT 243 04/30/2021   Lab Results  Component Value Date   NA 141 04/30/2021   K 4.4 04/30/2021   CO2 28 04/30/2021   GLUCOSE 95 04/30/2021   BUN 11 04/30/2021   CREATININE 0.88 04/30/2021   BILITOT 0.4 04/30/2021   AST 17 04/30/2021   ALT 10 04/30/2021   PROT 6.7 04/30/2021   CALCIUM 10.3 04/30/2021   EGFR 65 04/30/2021   Lab Results  Component Value Date   CHOL 153 04/30/2021   Lab  Results  Component Value Date   HDL 61 04/30/2021   Lab Results  Component  Value Date   LDLCALC 74 04/30/2021   Lab Results  Component Value Date   TRIG 92 04/30/2021   Lab Results  Component Value Date   CHOLHDL 2.5 04/30/2021   No results found for: HGBA1C    Assessment & Plan:  Patient is in normal sinus rhythm, EKG does not show any acute changes  nonspecific ST-T abnormalities are present. Problem List Items Addressed This Visit       Cardiovascular and Mediastinum   Paroxysmal atrial fibrillation ([place]) - Primary    Patient is in normal sinus rhythm        Digestive   Gastroesophageal reflux disease without esophagitis    Counseling  If a person has gastroesophageal reflux disease (GERD), food and stomach acid move back up into the esophagus and cause symptoms or problems such as damage to the esophagus.  Anti-reflux measures include: raising the head of the bed, avoiding tight clothing or belts, avoiding eating late at night, not lying down shortly after mealtime, and achieving weight loss.  Avoid ASA, NSAID's, caffeine, alcohol, and tobacco.   OTC Pepcid and/or Tums are often very helpful for as needed use.   However, for persisting chronic or daily symptoms, stronger medications like Omeprazole may be needed.  You may need to avoid foods and drinks such as: ? Coffee and tea (with or without caffeine). ? Drinks that contain alcohol. ? Energy drinks and sports drinks. ? Bubbly (carbonated) drinks or sodas. ? Chocolate and cocoa. ? Peppermint and mint flavorings. ? Garlic and onions. ? Horseradish. ? Spicy and acidic foods. These include peppers, chili powder, curry powder, vinegar, hot sauces, and BBQ sauce. ? Citrus fruit juices and citrus fruits, such as oranges, lemons, and limes. ? Tomato-based foods. These include red sauce, chili, salsa, and pizza with red sauce. ? Fried and fatty foods. These include donuts, french fries, potato chips, and high-fat  dressings. ? High-fat meats. These include hot dogs, rib eye steak, sausage, ham, and bacon.         Nervous and Auditory   Bilateral carpal tunnel syndrome    Stable at the present time      Relevant Medications   ALPRAZolam (XANAX) 0.25 MG tablet     Other   Cardiac pacemaker in situ    Pacemaker working well      Anxiety    - Patient experiencing high levels of anxiety.  - Encouraged patient to engage in relaxing activities like yoga, meditation, journaling, going for a walk, or participating in a hobby.  - Encouraged patient to reach out to trusted friends or family members about recent struggles, Patient was advised to read A book, how to stop worrying and start living, it is good book to read to control  the stress       Relevant Medications   ALPRAZolam (XANAX) 0.25 MG tablet    Meds ordered this encounter  Medications   ALPRAZolam (XANAX) 0.25 MG tablet    Sig: Take 1 tablet (0.25 mg total) by mouth 2 (two) times daily.    Dispense:  60 tablet    Refill:  0    Not to exceed 4 additional fills before 09/19/2020    Follow-up: No follow-ups on file.    Cletis Athens, MD

## 2021-12-17 NOTE — Addendum Note (Signed)
Addended by: Alois Cliche on: 12/17/2021 09:32 AM   Modules accepted: Orders

## 2021-12-17 NOTE — Assessment & Plan Note (Signed)
Patient is in normal sinus rhythm

## 2021-12-27 DIAGNOSIS — H25811 Combined forms of age-related cataract, right eye: Secondary | ICD-10-CM | POA: Diagnosis not present

## 2022-02-08 ENCOUNTER — Other Ambulatory Visit: Payer: Self-pay | Admitting: *Deleted

## 2022-02-08 MED ORDER — NITROFURANTOIN MACROCRYSTAL 100 MG PO CAPS: 100.0000 mg | ORAL_CAPSULE | Freq: Two times a day (BID) | ORAL | 2 refills | Status: DC

## 2022-02-11 ENCOUNTER — Other Ambulatory Visit: Payer: Self-pay | Admitting: *Deleted

## 2022-02-11 DIAGNOSIS — F419 Anxiety disorder, unspecified: Secondary | ICD-10-CM

## 2022-02-11 MED ORDER — ALPRAZOLAM 0.25 MG PO TABS: 0.2500 mg | ORAL_TABLET | Freq: Two times a day (BID) | ORAL | 0 refills | Status: DC

## 2022-02-18 ENCOUNTER — Other Ambulatory Visit: Payer: Self-pay | Admitting: Internal Medicine

## 2022-02-25 ENCOUNTER — Ambulatory Visit (INDEPENDENT_AMBULATORY_CARE_PROVIDER_SITE_OTHER): Payer: Medicare Other | Admitting: Internal Medicine

## 2022-02-25 VITALS — BP 151/85 | HR 82 | Ht 64.0 in | Wt 142.5 lb

## 2022-02-25 DIAGNOSIS — I480 Paroxysmal atrial fibrillation: Secondary | ICD-10-CM | POA: Diagnosis not present

## 2022-02-25 DIAGNOSIS — F419 Anxiety disorder, unspecified: Secondary | ICD-10-CM

## 2022-02-25 DIAGNOSIS — Z950 Presence of cardiac pacemaker: Secondary | ICD-10-CM | POA: Diagnosis not present

## 2022-02-25 DIAGNOSIS — E782 Mixed hyperlipidemia: Secondary | ICD-10-CM

## 2022-02-25 DIAGNOSIS — K219 Gastro-esophageal reflux disease without esophagitis: Secondary | ICD-10-CM | POA: Diagnosis not present

## 2022-02-25 NOTE — Assessment & Plan Note (Signed)
Hypercholesterolemia  I advised the patient to follow Mediterranean diet This diet is rich in fruits vegetables and whole grain, and This diet is also rich in fish and lean meat Patient should also eat a handful of almonds or walnuts daily Recent heart study indicated that average follow-up on this kind of diet reduces the cardiovascular mortality by 50 to 70%== 

## 2022-02-25 NOTE — Assessment & Plan Note (Signed)
Assessment & Plan: ? ?Note: Medical Device Follow-up ? ?Patient pacemaker was interrogated by pacemakers analyzer, battery status is okay.  No programming changes were indicated after the review of the data.  Histogram shows no change since the last interrogation ?Atrial and ventricular sensing thresholds were found to be acceptable ?Impedance was checked and it was found to be normal.  Thresholds were found to be okay on evaluation of rhythm problem.  No high rate or low rate arrhythmia were noted.  Estimated battery longevity is 6 months I have personally reviewed the device data and amended the report as necessary.  ?

## 2022-02-25 NOTE — Progress Notes (Signed)
Established Patient Office Visit  Subjective:  Patient ID: Jessica Browning, female    DOB: 10/09/1934  Age: 88 y.o. MRN: [id number]  CC:  Chief Complaint  Patient presents with   Pacemaker Check    HPI  Jessica Browning presents for pacemaker check and office visit.  Patient complains of dizziness she denies any chest pain or fluttering of the heart.  There is no swelling of the leg.  Past medical history and surgical history medication listed below. Past Medical History:  Diagnosis Date   Basal cell carcinoma 06/18/2016   Posterior neck. Nodular pattern.   Hypertension     Past Surgical History:  Procedure Laterality Date   ABDOMINAL HYSTERECTOMY     BLADDER SURGERY     VEIN LIGATION AND STRIPPING     bilateral legs    History reviewed. No pertinent family history.  Social History   Socioeconomic History   Marital status: Single    Spouse name: Not on file   Number of children: Not on file   Years of education: Not on file   Highest education level: Not on file  Occupational History   Not on file  Tobacco Use   Smoking status: Never   Smokeless tobacco: Never  Substance and Sexual Activity   Alcohol use: Never   Drug use: Never   Sexual activity: Not Currently  Other Topics Concern   Not on file  Social History Narrative   Not on file   Social Determinants of Health   Financial Resource Strain: Low Risk    Difficulty of Paying Living Expenses: Not very hard  Food Insecurity: No Food Insecurity   Worried About Running Out of Food in the Last Year: Never true   Ran Out of Food in the Last Year: Never true  Transportation Needs: No Transportation Needs   Lack of Transportation (Medical): No   Lack of Transportation (Non-Medical): No  Physical Activity: Insufficiently Active   Days of Exercise per Week: 2 days   Minutes of Exercise per Session: 20 min  Stress: No Stress Concern Present   Feeling of Stress : Only a little  Social Connections:  Moderately Integrated   Frequency of Communication with Friends and Family: More than three times a week   Frequency of Social Gatherings with Friends and Family: Three times a week   Attends Religious Services: 1 to 4 times per year   Active Member of Clubs or Organizations: Yes   Attends Banker Meetings: 1 to 4 times per year   Marital Status: Widowed  Catering manager Violence: Not At Risk   Fear of Current or Ex-Partner: No   Emotionally Abused: No   Physically Abused: No   Sexually Abused: No     Current Outpatient Medications:    ALPRAZolam (XANAX) 0.25 MG tablet, Take 1 tablet (0.25 mg total) by mouth 2 (two) times daily., Disp: 60 tablet, Rfl: 0   amLODipine (NORVASC) 5 MG tablet, TAKE 1 TABLET BY MOUTH EVERY DAY, Disp: 90 tablet, Rfl: 2   aspirin EC 81 MG tablet, Take 81 mg by mouth daily. Swallow whole., Disp: , Rfl:    atorvastatin (LIPITOR) 10 MG tablet, TAKE 1 TABLET BY MOUTH EVERY DAY, Disp: 90 tablet, Rfl: 3   CALCIUM 1000 + D 1000-800 MG-UNIT TABS, Take 1 tablet by mouth daily., Disp: 30 tablet, Rfl: 6   EPINEPHrine 0.3 mg/0.3 mL IJ SOAJ injection, Inject 0.3 mg into the muscle as needed for anaphylaxis.,  Disp: , Rfl:    nitrofurantoin (MACRODANTIN) 100 MG capsule, TAKE 1 CAPSULE BY MOUTH TWICE A DAY, Disp: 60 capsule, Rfl: 1   Allergies  Allergen Reactions   Doxycycline Nausea And Vomiting   Tetanus Toxoids Hives    ROS Review of Systems  Constitutional: Negative.   HENT: Negative.    Eyes: Negative.   Respiratory: Negative.    Cardiovascular: Negative.   Gastrointestinal: Negative.   Endocrine: Negative.   Genitourinary: Negative.   Musculoskeletal: Negative.   Skin: Negative.   Allergic/Immunologic: Negative.   Neurological: Negative.   Hematological: Negative.   Psychiatric/Behavioral: Negative.    All other systems reviewed and are negative.    Objective:    Physical Exam Vitals reviewed.  Constitutional:      Appearance: Normal  appearance.  HENT:     Mouth/Throat:     Mouth: Mucous membranes are moist.  Eyes:     Pupils: Pupils are equal, round, and reactive to light.  Neck:     Vascular: No carotid bruit.  Cardiovascular:     Rate and Rhythm: Normal rate and regular rhythm.     Pulses: Normal pulses.     Heart sounds: Normal heart sounds.  Pulmonary:     Effort: Pulmonary effort is normal.     Breath sounds: Normal breath sounds.  Abdominal:     General: Bowel sounds are normal.     Palpations: Abdomen is soft. There is no hepatomegaly, splenomegaly or mass.     Tenderness: There is no abdominal tenderness.     Hernia: No hernia is present.  Musculoskeletal:        General: No tenderness.     Cervical back: Neck supple.     Right lower leg: No edema.     Left lower leg: No edema.  Skin:    Findings: No rash.  Neurological:     Mental Status: She is alert and oriented to person, place, and time.     Motor: No weakness.  Psychiatric:        Mood and Affect: Mood and affect normal.        Behavior: Behavior normal.    BP (!) 151/85   Pulse 82   Ht 5\' 4"  (1.626 m)   Wt 142 lb 8 oz (64.6 kg)   BMI 24.46 kg/m  Wt Readings from Last 3 Encounters:  02/25/22 142 lb 8 oz (64.6 kg)  12/17/21 142 lb 8 oz (64.6 kg)  08/27/21 140 lb 4.8 oz (63.6 kg)     Health Maintenance Due  Topic Date Due   DEXA SCAN  Never done   Zoster Vaccines- Shingrix (2 of 2) 01/26/2013   Pneumonia Vaccine 87+ Years old (2 - PCV) 07/22/2019   TETANUS/TDAP  12/29/2019   COVID-19 Vaccine (5 - Booster for Moderna series) 10/16/2020    There are no preventive care reminders to display for this patient.  Lab Results  Component Value Date   TSH 4.26 04/30/2021   Lab Results  Component Value Date   WBC 5.4 04/30/2021   HGB 13.0 04/30/2021   HCT 40.8 04/30/2021   MCV 90.7 04/30/2021   PLT 243 04/30/2021   Lab Results  Component Value Date   NA 141 04/30/2021   K 4.4 04/30/2021   CO2 28 04/30/2021   GLUCOSE 95  04/30/2021   BUN 11 04/30/2021   CREATININE 0.88 04/30/2021   BILITOT 0.4 04/30/2021   AST 17 04/30/2021   ALT 10 04/30/2021  PROT 6.7 04/30/2021   CALCIUM 10.3 04/30/2021   EGFR 65 04/30/2021   Lab Results  Component Value Date   CHOL 153 04/30/2021   Lab Results  Component Value Date   HDL 61 04/30/2021   Lab Results  Component Value Date   LDLCALC 74 04/30/2021   Lab Results  Component Value Date   TRIG 92 04/30/2021   Lab Results  Component Value Date   CHOLHDL 2.5 04/30/2021   No results found for: HGBA1C    Assessment & Plan:   Problem List Items Addressed This Visit       Cardiovascular and Mediastinum   Paroxysmal atrial fibrillation (HCC)    Patient denies any chest pain or shortness of breath there is no swelling of the legs no history of syncope         Digestive   Gastroesophageal reflux disease without esophagitis    - The patient's GERD is stable on medication.  - Instructed the patient to avoid eating spicy and acidic foods, as well as foods high in fat. - Instructed the patient to avoid eating large meals or meals 2-3 hours prior to sleeping.         Other   Cardiac pacemaker in situ - Primary    Assessment & Plan:  Note: Medical Device Follow-up  Patient pacemaker was interrogated by pacemakers analyzer, battery status is okay.  No programming changes were indicated after the review of the data.  Histogram shows no change since the last interrogation Atrial and ventricular sensing thresholds were found to be acceptable Impedance was checked and it was found to be normal.  Thresholds were found to be okay on evaluation of rhythm problem.  No high rate or low rate arrhythmia were noted.  Estimated battery longevity is 6 months I have personally reviewed the device data and amended the report as necessary.        Relevant Orders   PACEMAKER IN CLINIC CHECK   Anxiety    - Patient experiencing high levels of anxiety.  - Encouraged  patient to engage in relaxing activities like yoga, meditation, journaling, going for a walk, or participating in a hobby.  - Encouraged patient to reach out to trusted friends or family members about recent struggles, Patient was advised to read A book, how to stop worrying and start living, it is good book to read to control  the stress        Mixed hyperlipidemia    Hypercholesterolemia  I advised the patient to follow Mediterranean diet This diet is rich in fruits vegetables and whole grain, and This diet is also rich in fish and lean meat Patient should also eat a handful of almonds or walnuts daily Recent heart study indicated that average follow-up on this kind of diet reduces the cardiovascular mortality by 50 to 70%==        No orders of the defined types were placed in this encounter.   Follow-up: No follow-ups on file.    Corky Downs, MD

## 2022-02-25 NOTE — Assessment & Plan Note (Signed)
-   The patient's GERD is stable on medication.  - Instructed the patient to avoid eating spicy and acidic foods, as well as foods high in fat. - Instructed the patient to avoid eating large meals or meals 2-3 hours prior to sleeping. 

## 2022-02-25 NOTE — Assessment & Plan Note (Signed)
Patient denies any chest pain or shortness of breath there is no swelling of the legs no history of syncope ?

## 2022-02-25 NOTE — Assessment & Plan Note (Signed)
-   Patient experiencing high levels of anxiety.  - Encouraged patient to engage in relaxing activities like yoga, meditation, journaling, going for a walk, or participating in a hobby.  - Encouraged patient to reach out to trusted friends or family members about recent struggles, Patient was advised to read A book, how to stop worrying and start living, it is good book to read to control  the stress  

## 2022-03-06 ENCOUNTER — Ambulatory Visit (INDEPENDENT_AMBULATORY_CARE_PROVIDER_SITE_OTHER): Payer: Medicare Other | Admitting: Internal Medicine

## 2022-03-06 ENCOUNTER — Encounter: Payer: Self-pay | Admitting: Internal Medicine

## 2022-03-06 VITALS — BP 155/81 | HR 83 | Ht 64.0 in | Wt 142.0 lb

## 2022-03-06 DIAGNOSIS — Z950 Presence of cardiac pacemaker: Secondary | ICD-10-CM

## 2022-03-06 DIAGNOSIS — I739 Peripheral vascular disease, unspecified: Secondary | ICD-10-CM | POA: Diagnosis not present

## 2022-03-06 DIAGNOSIS — K219 Gastro-esophageal reflux disease without esophagitis: Secondary | ICD-10-CM | POA: Diagnosis not present

## 2022-03-06 DIAGNOSIS — E782 Mixed hyperlipidemia: Secondary | ICD-10-CM | POA: Diagnosis not present

## 2022-03-06 DIAGNOSIS — I480 Paroxysmal atrial fibrillation: Secondary | ICD-10-CM

## 2022-03-06 NOTE — Assessment & Plan Note (Signed)
-   The patient's GERD is stable on medication.  - Instructed the patient to avoid eating spicy and acidic foods, as well as foods high in fat. - Instructed the patient to avoid eating large meals or meals 2-3 hours prior to sleeping. 

## 2022-03-06 NOTE — Assessment & Plan Note (Signed)
Patient is cleared to enter pacemaker battery life we will check the pacemaker battery again in a few weeks ?

## 2022-03-06 NOTE — Progress Notes (Signed)
Injury, increasing 2 years morning make the appointment for me to make ? ?Appointment need to make mammogram appointment ?A date and late getting more list: Scheduled for months but it can get him until June so symptoms did see if she gets take care of them.  Please more distended a [place] you follow-up there next year going to the [employer] in [place] ?Plan is to probably have this ?Just say ?This is stable at the present time based ? ?Feels about 2 just to be a virus ? ?[id number] low blood sugar causes ? ?She is just also has jump machine ? ?Get dizzy, numbness ?Also assist did she get weight [facility] impression okay cannot not stop it loss ?Take fluids ?Come every 2 weeks so this was the week she was post to come ? ?2 ?Signs and Symptoms of COVID-19 (e.g., fever, dry cough, and/or SOB), no diagnosis made (assign the appropriate code(s)): ?   Shoulder ? ?Plan ?Prescription given patient will follow ? ?Established Patient Office Visit ? ?Subjective:  ?Patient ID: Jessica Browning, female    DOB: 01/20/1934  Age: 88 y.o. MRN: [id number] ? ?CC:  ?Chief Complaint  ?Patient presents with  ? abi  ? ? ?HPI ? ?[facility] presents for check up, patient ABI is okay, her gait is normal, she does not complain of any dizziness, she denies any chest pain or shortness of breath.  Her lower legs were examined she was not found to have any swelling of the legs circulation is 1+, toes are okay rest of the past medical history surgical history and medication has been listed below.  Patient does not have any tachycardia ? ?Past Medical History:  ?Diagnosis Date  ? Basal cell carcinoma 06/18/2016  ? Posterior neck. Nodular pattern.  ? Hypertension   ? ? ?Past Surgical History:  ?Procedure Laterality Date  ? ABDOMINAL HYSTERECTOMY    ? BLADDER SURGERY    ? VEIN LIGATION AND STRIPPING    ? bilateral legs  ? ? ?History reviewed. No pertinent family history. ? ?Social History   ? ?Socioeconomic History  ? Marital status: Single  ?  Spouse name: Not on file  ? Number of children: Not on file  ? Years of education: Not on file  ? Highest education level: Not on file  ?Occupational History  ? Not on file  ?Tobacco Use  ? Smoking status: Never  ? Smokeless tobacco: Never  ?Substance and Sexual Activity  ? Alcohol use: Never  ? Drug use: Never  ? Sexual activity: Not Currently  ?Other Topics Concern  ? Not on file  ?Social History Narrative  ? Not on file  ? ?Social Determinants of Health  ? ?Financial Resource Strain: Low Risk   ? Difficulty of Paying Living Expenses: Not very hard  ?Food Insecurity: No Food Insecurity  ? Worried About Charity fundraiser in the Last Year: Never true  ? Ran Out of Food in the Last Year: Never true  ?Transportation Needs: No Transportation Needs  ? Lack of Transportation (Medical): No  ? Lack of Transportation (Non-Medical): No  ?Physical Activity: Insufficiently Active  ? Days of Exercise per Week: 2 days  ? Minutes of Exercise per Session: 20 min  ?Stress: No Stress Concern Present  ? Feeling of Stress : Only a little  ?Social Connections: Moderately Integrated  ? Frequency of Communication with Friends and Family: More than three times a week  ?  Frequency of Social Gatherings with Friends and Family: Three times a week  ? Attends Religious Services: 1 to 4 times per year  ? Active Member of Clubs or Organizations: Yes  ? Attends Archivist Meetings: 1 to 4 times per year  ? Marital Status: Widowed  ?Intimate Partner Violence: Not At Risk  ? Fear of Current or Ex-Partner: No  ? Emotionally Abused: No  ? Physically Abused: No  ? Sexually Abused: No  ? ? ? ?Current Outpatient Medications:  ?  ALPRAZolam (XANAX) 0.25 MG tablet, Take 1 tablet (0.25 mg total) by mouth 2 (two) times daily., Disp: 60 tablet, Rfl: 0 ?  amLODipine (NORVASC) 5 MG tablet, TAKE 1 TABLET BY MOUTH EVERY DAY, Disp: 90 tablet, Rfl: 2 ?  aspirin EC 81 MG tablet, Take 81 mg by mouth  daily. Swallow whole., Disp: , Rfl:  ?  atorvastatin (LIPITOR) 10 MG tablet, TAKE 1 TABLET BY MOUTH EVERY DAY, Disp: 90 tablet, Rfl: 3 ?  CALCIUM 1000 + D 1000-800 MG-UNIT TABS, Take 1 tablet by mouth daily., Disp: 30 tablet, Rfl: 6 ?  EPINEPHrine 0.3 mg/0.3 mL IJ SOAJ injection, Inject 0.3 mg into the muscle as needed for anaphylaxis., Disp: , Rfl:  ?  nitrofurantoin (MACRODANTIN) 100 MG capsule, TAKE 1 CAPSULE BY MOUTH TWICE A DAY, Disp: 60 capsule, Rfl: 1  ? ?Allergies  ?Allergen Reactions  ? Doxycycline Nausea And Vomiting  ? Tetanus Toxoids Hives  ? ? ?ROS ?Review of Systems  ?Constitutional: Negative.   ?HENT: Negative.    ?Eyes: Negative.   ?Respiratory: Negative.    ?Cardiovascular: Negative.   ?Gastrointestinal: Negative.   ?Endocrine: Negative.   ?Genitourinary: Negative.   ?Musculoskeletal: Negative.   ?Skin: Negative.   ?Allergic/Immunologic: Negative.   ?Neurological: Negative.   ?Hematological: Negative.   ?Psychiatric/Behavioral: Negative.    ?All other systems reviewed and are negative. ? ?  ?Objective:  ?  ?Physical Exam ?Vitals reviewed.  ?Constitutional:   ?   Appearance: Normal appearance.  ?HENT:  ?   Mouth/Throat:  ?   Mouth: Mucous membranes are moist.  ?Eyes:  ?   Pupils: Pupils are equal, round, and reactive to light.  ?Neck:  ?   Vascular: No carotid bruit.  ?Cardiovascular:  ?   Rate and Rhythm: Normal rate and regular rhythm.  ?   Pulses: Normal pulses.  ?   Heart sounds: Normal heart sounds.  ?Pulmonary:  ?   Effort: Pulmonary effort is normal.  ?   Breath sounds: Normal breath sounds.  ?Abdominal:  ?   General: Bowel sounds are normal.  ?   Palpations: Abdomen is soft. There is no hepatomegaly, splenomegaly or mass.  ?   Tenderness: There is no abdominal tenderness.  ?   Hernia: No hernia is present.  ?Musculoskeletal:     ?   General: No tenderness.  ?   Cervical back: Neck supple.  ?   Right lower leg: No edema.  ?   Left lower leg: No edema.  ?Skin: ?   Findings: No rash.   ?Neurological:  ?   Mental Status: She is alert and oriented to person, place, and time.  ?   Motor: No weakness.  ?Psychiatric:     ?   Mood and Affect: Mood and affect normal.     ?   Behavior: Behavior normal.  ? ? ?BP (!) 155/81   Pulse 83   Ht _0  (1.626 m)   Wt 142  lb (64.4 kg)   BMI 24.37 kg/m?  ?Wt Readings from Last 3 Encounters:  ?03/06/22 142 lb (64.4 kg)  ?02/25/22 142 lb 8 oz (64.6 kg)  ?12/17/21 142 lb 8 oz (64.6 kg)  ? ? ? ?Health Maintenance Due  ?Topic Date Due  ? DEXA SCAN  Never done  ? Zoster Vaccines- Shingrix (2 of 2) 01/26/2013  ? Pneumonia Vaccine 43+ Years old (2 - PCV) 07/22/2019  ? TETANUS/TDAP  12/29/2019  ? COVID-19 Vaccine (5 - Booster for Moderna series) 10/16/2020  ? ? ?There are no preventive care reminders to display for this patient. ? ?Lab Results  ?Component Value Date  ? TSH 4.26 04/30/2021  ? ?Lab Results  ?Component Value Date  ? WBC 5.4 04/30/2021  ? HGB 13.0 04/30/2021  ? HCT 40.8 04/30/2021  ? MCV 90.7 04/30/2021  ? PLT 243 04/30/2021  ? ?Lab Results  ?Component Value Date  ? NA 141 04/30/2021  ? K 4.4 04/30/2021  ? CO2 28 04/30/2021  ? GLUCOSE 95 04/30/2021  ? BUN 11 04/30/2021  ? CREATININE 0.88 04/30/2021  ? BILITOT 0.4 04/30/2021  ? AST 17 04/30/2021  ? ALT 10 04/30/2021  ? PROT 6.7 04/30/2021  ? CALCIUM 10.3 04/30/2021  ? EGFR 65 04/30/2021  ? ?Lab Results  ?Component Value Date  ? CHOL 153 04/30/2021  ? ?Lab Results  ?Component Value Date  ? HDL 61 04/30/2021  ? ?Lab Results  ?Component Value Date  ? [place] 74 04/30/2021  ? ?Lab Results  ?Component Value Date  ? TRIG 92 04/30/2021  ? ?Lab Results  ?Component Value Date  ? CHOLHDL 2.5 04/30/2021  ? ?No results found for: HGBA1C ? ?  ?Assessment & Plan:  ? ?Problem List Items Addressed This Visit   ? ?  ? Cardiovascular and Mediastinum  ? Paroxysmal atrial fibrillation (HCC)  ?  Patient does not have any tachycardia or passing out spell.  There is no swelling of the legs, no history of TIA ? ?  ?  ?  ? Digestive   ? Gastroesophageal reflux disease without esophagitis  ?  - The patient's GERD is stable on medication.  ?- Instructed the patient to avoid eating spicy and acidic foods, as well as foods high in fat. ?- Instructed the p

## 2022-03-06 NOTE — Assessment & Plan Note (Signed)
Hypercholesterolemia  I advised the patient to follow Mediterranean diet This diet is rich in fruits vegetables and whole grain, and This diet is also rich in fish and lean meat Patient should also eat a handful of almonds or walnuts daily Recent heart study indicated that average follow-up on this kind of diet reduces the cardiovascular mortality by 50 to 70%== 

## 2022-03-06 NOTE — Assessment & Plan Note (Signed)
Patient does not have any tachycardia or passing out spell.  There is no swelling of the legs, no history of TIA ?

## 2022-03-09 ENCOUNTER — Other Ambulatory Visit: Payer: Self-pay | Admitting: Internal Medicine

## 2022-03-12 ENCOUNTER — Other Ambulatory Visit: Payer: Self-pay | Admitting: *Deleted

## 2022-03-12 MED ORDER — AMLODIPINE BESYLATE 5 MG PO TABS: 5.0000 mg | ORAL_TABLET | Freq: Every day | ORAL | 2 refills | Status: DC

## 2022-03-25 ENCOUNTER — Other Ambulatory Visit: Payer: Self-pay | Admitting: Internal Medicine

## 2022-04-11 ENCOUNTER — Other Ambulatory Visit: Payer: Self-pay | Admitting: Internal Medicine

## 2022-04-11 DIAGNOSIS — F419 Anxiety disorder, unspecified: Secondary | ICD-10-CM

## 2022-04-12 ENCOUNTER — Other Ambulatory Visit: Payer: Self-pay

## 2022-04-12 DIAGNOSIS — F419 Anxiety disorder, unspecified: Secondary | ICD-10-CM

## 2022-04-12 MED ORDER — ALPRAZOLAM 0.25 MG PO TABS: 0.2500 mg | ORAL_TABLET | Freq: Two times a day (BID) | ORAL | 0 refills | Status: DC

## 2022-04-15 ENCOUNTER — Other Ambulatory Visit: Payer: Self-pay | Admitting: Internal Medicine

## 2022-04-15 DIAGNOSIS — F419 Anxiety disorder, unspecified: Secondary | ICD-10-CM

## 2022-04-16 ENCOUNTER — Other Ambulatory Visit: Payer: Self-pay | Admitting: Internal Medicine

## 2022-04-16 ENCOUNTER — Other Ambulatory Visit: Payer: Self-pay | Admitting: *Deleted

## 2022-04-16 DIAGNOSIS — F419 Anxiety disorder, unspecified: Secondary | ICD-10-CM

## 2022-04-16 MED ORDER — NITROFURANTOIN MACROCRYSTAL 100 MG PO CAPS: 100.0000 mg | ORAL_CAPSULE | Freq: Two times a day (BID) | ORAL | 1 refills | Status: DC

## 2022-04-30 ENCOUNTER — Ambulatory Visit: Payer: Medicare Other

## 2022-04-30 DIAGNOSIS — Z0000 Encounter for general adult medical examination without abnormal findings: Secondary | ICD-10-CM | POA: Diagnosis not present

## 2022-04-30 DIAGNOSIS — I480 Paroxysmal atrial fibrillation: Secondary | ICD-10-CM | POA: Diagnosis not present

## 2022-04-30 DIAGNOSIS — I10 Essential (primary) hypertension: Secondary | ICD-10-CM | POA: Diagnosis not present

## 2022-04-30 DIAGNOSIS — R0602 Shortness of breath: Secondary | ICD-10-CM

## 2022-05-01 LAB — CBC WITH DIFFERENTIAL/PLATELET
Absolute Monocytes: 559 cells/uL (ref 200–950)
Basophils Absolute: 22 cells/uL (ref 0–200)
Basophils Relative: 0.5 %
Eosinophils Absolute: 99 cells/uL (ref 15–500)
Eosinophils Relative: 2.3 %
HCT: 37.7 % (ref 35.0–45.0)
Hemoglobin: 12.2 g/dL (ref 11.7–15.5)
Lymphs Abs: 1449 cells/uL (ref 850–3900)
MCH: 29.7 pg (ref 27.0–33.0)
MCHC: 32.4 g/dL (ref 32.0–36.0)
MCV: 91.7 fL (ref 80.0–100.0)
MPV: 11.3 fL (ref 7.5–12.5)
Monocytes Relative: 13 %
Neutro Abs: 2172 cells/uL (ref 1500–7800)
Neutrophils Relative %: 50.5 %
Platelets: 221 10*3/uL (ref 140–400)
RBC: 4.11 10*6/uL (ref 3.80–5.10)
RDW: 12.8 % (ref 11.0–15.0)
Total Lymphocyte: 33.7 %
WBC: 4.3 10*3/uL (ref 3.8–10.8)

## 2022-05-01 LAB — COMPLETE METABOLIC PANEL WITH GFR
AG Ratio: 2.1 (calc) (ref 1.0–2.5)
ALT: 9 U/L (ref 6–29)
AST: 16 U/L (ref 10–35)
Albumin: 4.4 g/dL (ref 3.6–5.1)
Alkaline phosphatase (APISO): 75 U/L (ref 37–153)
BUN/Creatinine Ratio: 11 (calc) (ref 6–22)
BUN: 11 mg/dL (ref 7–25)
CO2: 26 mmol/L (ref 20–32)
Calcium: 9.4 mg/dL (ref 8.6–10.4)
Chloride: 105 mmol/L (ref 98–110)
Creat: 1.01 mg/dL — ABNORMAL HIGH (ref 0.60–0.95)
Globulin: 2.1 g/dL (calc) (ref 1.9–3.7)
Glucose, Bld: 91 mg/dL (ref 65–99)
Potassium: 4.6 mmol/L (ref 3.5–5.3)
Sodium: 140 mmol/L (ref 135–146)
Total Bilirubin: 0.5 mg/dL (ref 0.2–1.2)
Total Protein: 6.5 g/dL (ref 6.1–8.1)
eGFR: 55 mL/min/{1.73_m2} — ABNORMAL LOW (ref >=60)

## 2022-05-01 LAB — LIPID PANEL
Cholesterol: 146 mg/dL (ref <200)
HDL: 62 mg/dL (ref >=50)
LDL Cholesterol (Calc): 69 mg/dL (calc)
Non-HDL Cholesterol (Calc): 84 mg/dL (calc) (ref <130)
Total CHOL/HDL Ratio: 2.4 (calc) (ref <5.0)
Triglycerides: 72 mg/dL (ref <150)

## 2022-05-01 LAB — TSH: TSH: 2.74 mIU/L (ref 0.40–4.50)

## 2022-05-27 ENCOUNTER — Ambulatory Visit (INDEPENDENT_AMBULATORY_CARE_PROVIDER_SITE_OTHER): Payer: Medicare Other | Admitting: Internal Medicine

## 2022-05-27 VITALS — BP 128/84 | HR 86

## 2022-05-27 DIAGNOSIS — Z45018 Encounter for adjustment and management of other part of cardiac pacemaker: Secondary | ICD-10-CM | POA: Diagnosis not present

## 2022-05-27 DIAGNOSIS — Z950 Presence of cardiac pacemaker: Secondary | ICD-10-CM | POA: Diagnosis not present

## 2022-05-30 NOTE — Progress Notes (Deleted)
labs

## 2022-05-31 NOTE — Assessment & Plan Note (Signed)
Note: Medical Device Follow-up  Patient pacemaker was interrogated by pacemakers analyzer, battery status is okay.  No programming changes were indicated after the review of the data.  Histogram shows no change since the last interrogation Atrial and ventricular sensing thresholds were found to be acceptable Impedance was checked and it was found to be normal.  Thresholds were found to be okay on evaluation of rhythm problem.  No high rate or low rate arrhythmia were noted.  Estimated battery longevity is ERI  I have personally reviewed the device data and amended the report as necessary.

## 2022-05-31 NOTE — Progress Notes (Signed)
Established Patient Office Visit  Subjective:  Patient ID: Jessica Browning, female    DOB: [birth year]  Age: 88 y.o. MRN: [id number]  CC:  Chief Complaint  Patient presents with   Pacemaker Check     HPI  Jessica Browning presents for pacemaker check  Past Medical History:  Diagnosis Date   Basal cell carcinoma 06/18/2016   Posterior neck. Nodular pattern.   Hypertension     Past Surgical History:  Procedure Laterality Date   ABDOMINAL HYSTERECTOMY     BLADDER SURGERY     VEIN LIGATION AND STRIPPING     bilateral legs    History reviewed. No pertinent family history.  Social History   Socioeconomic History   Marital status: Single    Spouse name: Not on file   Number of children: Not on file   Years of education: Not on file   Highest education level: Not on file  Occupational History   Not on file  Tobacco Use   Smoking status: Never   Smokeless tobacco: Never  Substance and Sexual Activity   Alcohol use: Never   Drug use: Never   Sexual activity: Not Currently  Other Topics Concern   Not on file  Social History Narrative   Not on file   Social Determinants of Health   Financial Resource Strain: Low Risk  (07/12/2021)   Overall Financial Resource Strain (CARDIA)    Difficulty of Paying Living Expenses: Not very hard  Food Insecurity: No Food Insecurity (07/12/2021)   Hunger Vital Sign    Worried About Running Out of Food in the Last Year: Never true    Ran Out of Food in the Last Year: Never true  Transportation Needs: No Transportation Needs (07/12/2021)   PRAPARE - Hydrologist (Medical): No    Lack of Transportation (Non-Medical): No  Physical Activity: Insufficiently Active (07/12/2021)   Exercise Vital Sign    Days of Exercise per Week: 2 days    Minutes of Exercise per Session: 20 min  Stress: No Stress Concern Present (07/12/2021)   [place]     Feeling of Stress : Only a little  Social Connections: Moderately Integrated (07/12/2021)   Social Connection and Isolation Panel [NHANES]    Frequency of Communication with Friends and Family: More than three times a week    Frequency of Social Gatherings with Friends and Family: Three times a week    Attends Religious Services: 1 to 4 times per year    Active Member of Clubs or Organizations: Yes    Attends Archivist Meetings: 1 to 4 times per year    Marital Status: Widowed  Intimate Partner Violence: Not At Risk (07/12/2021)   Humiliation, Afraid, Rape, and Kick questionnaire    Fear of Current or Ex-Partner: No    Emotionally Abused: No    Physically Abused: No    Sexually Abused: No     Current Outpatient Medications:    ALPRAZolam (XANAX) 0.25 MG tablet, Take 1 tablet (0.25 mg total) by mouth 2 (two) times daily., Disp: 60 tablet, Rfl: 0   amLODipine (NORVASC) 5 MG tablet, Take 1 tablet (5 mg total) by mouth daily., Disp: 90 tablet, Rfl: 2   aspirin EC 81 MG tablet, Take 81 mg by mouth daily. Swallow whole., Disp: , Rfl:    atorvastatin (LIPITOR) 10 MG tablet, TAKE 1 TABLET BY MOUTH EVERY DAY, Disp: 90  tablet, Rfl: 3   Calcium Carb-Cholecalciferol 600-10 MG-MCG TABS, TAKE 2 TABLETS BY MOUTH EVERY DAY, Disp: 180 tablet, Rfl: 6   EPINEPHrine 0.3 mg/0.3 mL IJ SOAJ injection, Inject 0.3 mg into the muscle as needed for anaphylaxis., Disp: , Rfl:    nitrofurantoin (MACRODANTIN) 100 MG capsule, Take 1 capsule (100 mg total) by mouth 2 (two) times daily., Disp: 60 capsule, Rfl: 1   Allergies  Allergen Reactions   Doxycycline Nausea And Vomiting   Tetanus Toxoids Hives    ROS Review of Systems  Respiratory:  Negative for chest tightness.   Cardiovascular:  Negative for chest pain and leg swelling.  Gastrointestinal:  Negative for abdominal distention.  Hematological:  Negative for adenopathy.      Objective:    Physical Exam Cardiovascular:     Rate and  Rhythm: Regular rhythm.  Pulmonary:     Effort: Pulmonary effort is normal.  Abdominal:     Palpations: Abdomen is soft.  Skin:    General: Skin is warm.  Neurological:     General: No focal deficit present.     BP 128/84   Pulse 86  Wt Readings from Last 3 Encounters:  03/06/22 142 lb (64.4 kg)  02/25/22 142 lb 8 oz (64.6 kg)  12/17/21 142 lb 8 oz (64.6 kg)     Health Maintenance Due  Topic Date Due   DEXA SCAN  Never done   Zoster Vaccines- Shingrix (2 of 2) 01/26/2013   Pneumonia Vaccine 35+ Years old (2 - PCV) 07/22/2019   TETANUS/TDAP  12/29/2019   COVID-19 Vaccine (5 - Moderna risk series) 10/16/2020   INFLUENZA VACCINE  05/21/2022    There are no preventive care reminders to display for this patient.  Lab Results  Component Value Date   TSH 2.74 04/30/2022   Lab Results  Component Value Date   WBC 4.3 04/30/2022   HGB 12.2 04/30/2022   HCT 37.7 04/30/2022   MCV 91.7 04/30/2022   PLT 221 04/30/2022   Lab Results  Component Value Date   NA 140 04/30/2022   K 4.6 04/30/2022   CO2 26 04/30/2022   GLUCOSE 91 04/30/2022   BUN 11 04/30/2022   CREATININE 1.01 (H) 04/30/2022   BILITOT 0.5 04/30/2022   AST 16 04/30/2022   ALT 9 04/30/2022   PROT 6.5 04/30/2022   CALCIUM 9.4 04/30/2022   EGFR 55 (L) 04/30/2022   Lab Results  Component Value Date   CHOL 146 04/30/2022   Lab Results  Component Value Date   HDL 62 04/30/2022   Lab Results  Component Value Date   LDLCALC 69 04/30/2022   Lab Results  Component Value Date   TRIG 72 04/30/2022   Lab Results  Component Value Date   CHOLHDL 2.4 04/30/2022   No results found for: "HGBA1C"    Assessment & Plan:   Problem List Items Addressed This Visit       Other   Cardiac pacemaker in situ - Primary    Note: Medical Device Follow-up  Patient pacemaker was interrogated by pacemakers analyzer, battery status is okay.  No programming changes were indicated after the review of the data.   Histogram shows no change since the last interrogation Atrial and ventricular sensing thresholds were found to be acceptable Impedance was checked and it was found to be normal.  Thresholds were found to be okay on evaluation of rhythm problem.  No high rate or low rate arrhythmia were noted.  Estimated  battery longevity is ERI  I have personally reviewed the device data and amended the report as necessary.       Relevant Orders   PACEMAKER IN CLINIC CHECK   EKG 12-Lead (Completed)   Other Visit Diagnoses     Pacemaker end of life       Relevant Orders   Ambulatory referral to Cardiology      Report of the electrocardiogram.   nonspecific ST-T abnormalities , Right bundle block PACs, possible anterolateral myocardial infarction No orders of the defined types were placed in this encounter.  Patient will be referred to Dr. Quentin Ore Follow-up: No follow-ups on file.    Cletis Athens, MD

## 2022-06-04 ENCOUNTER — Ambulatory Visit (INDEPENDENT_AMBULATORY_CARE_PROVIDER_SITE_OTHER): Payer: Medicare Other | Admitting: Internal Medicine

## 2022-06-04 ENCOUNTER — Encounter: Payer: Self-pay | Admitting: Internal Medicine

## 2022-06-04 VITALS — BP 128/72 | HR 68 | Ht 64.0 in | Wt 137.6 lb

## 2022-06-04 DIAGNOSIS — K219 Gastro-esophageal reflux disease without esophagitis: Secondary | ICD-10-CM | POA: Diagnosis not present

## 2022-06-04 DIAGNOSIS — Z950 Presence of cardiac pacemaker: Secondary | ICD-10-CM

## 2022-06-04 DIAGNOSIS — F419 Anxiety disorder, unspecified: Secondary | ICD-10-CM | POA: Diagnosis not present

## 2022-06-04 NOTE — Assessment & Plan Note (Signed)
Patient pacemaker is at end of battery life.  Refer the patient to Dr. Quentin Ore

## 2022-06-04 NOTE — Assessment & Plan Note (Signed)
   Keeping a stress/anxiety diary. This can help you learn what triggers your reaction and then learn ways to manage your response.  Thinking about how you react to certain situations. You may not be able to control everything, but you can control your response.  Making time for activities that help you relax and not feeling guilty about spending your time in this way.  Visual imagery and yoga can help you stay calm and relax. 

## 2022-06-04 NOTE — Progress Notes (Signed)
Established Patient Office Visit  Subjective:  Patient ID: Jessica Browning, female    DOB: 04-08-1934  Age: 88 y.o. MRN: [id number]  CC:  Chief Complaint  Patient presents with   pacemaker follow up     HPI  Jessica Browning presents for check up  Past Medical History:  Diagnosis Date   Basal cell carcinoma 06/18/2016   Posterior neck. Nodular pattern.   Hypertension     Past Surgical History:  Procedure Laterality Date   ABDOMINAL HYSTERECTOMY     BLADDER SURGERY     VEIN LIGATION AND STRIPPING     bilateral legs    History reviewed. No pertinent family history.  Social History   Socioeconomic History   Marital status: Single    Spouse name: Not on file   Number of children: Not on file   Years of education: Not on file   Highest education level: Not on file  Occupational History   Not on file  Tobacco Use   Smoking status: Never   Smokeless tobacco: Never  Substance and Sexual Activity   Alcohol use: Never   Drug use: Never   Sexual activity: Not Currently  Other Topics Concern   Not on file  Social History Narrative   Not on file   Social Determinants of Health   Financial Resource Strain: Low Risk  (07/12/2021)   Overall Financial Resource Strain (CARDIA)    Difficulty of Paying Living Expenses: Not very hard  Food Insecurity: No Food Insecurity (07/12/2021)   Hunger Vital Sign    Worried About Running Out of Food in the Last Year: Never true    Ran Out of Food in the Last Year: Never true  Transportation Needs: No Transportation Needs (07/12/2021)   PRAPARE - Hydrologist (Medical): No    Lack of Transportation (Non-Medical): No  Physical Activity: Insufficiently Active (07/12/2021)   Exercise Vital Sign    Days of Exercise per Week: 2 days    Minutes of Exercise per Session: 20 min  Stress: No Stress Concern Present (07/12/2021)   [place]     Feeling of Stress : Only a little  Social Connections: Moderately Integrated (07/12/2021)   Social Connection and Isolation Panel [NHANES]    Frequency of Communication with Friends and Family: More than three times a week    Frequency of Social Gatherings with Friends and Family: Three times a week    Attends Religious Services: 1 to 4 times per year    Active Member of Clubs or Organizations: Yes    Attends Archivist Meetings: 1 to 4 times per year    Marital Status: Widowed  Intimate Partner Violence: Not At Risk (07/12/2021)   Humiliation, Afraid, Rape, and Kick questionnaire    Fear of Current or Ex-Partner: No    Emotionally Abused: No    Physically Abused: No    Sexually Abused: No     Current Outpatient Medications:    ALPRAZolam (XANAX) 0.25 MG tablet, Take 1 tablet (0.25 mg total) by mouth 2 (two) times daily., Disp: 60 tablet, Rfl: 0   amLODipine (NORVASC) 5 MG tablet, Take 1 tablet (5 mg total) by mouth daily., Disp: 90 tablet, Rfl: 2   aspirin EC 81 MG tablet, Take 81 mg by mouth daily. Swallow whole., Disp: , Rfl:    atorvastatin (LIPITOR) 10 MG tablet, TAKE 1 TABLET BY MOUTH EVERY DAY, Disp:  90 tablet, Rfl: 3   Calcium Carb-Cholecalciferol 600-10 MG-MCG TABS, TAKE 2 TABLETS BY MOUTH EVERY DAY, Disp: 180 tablet, Rfl: 6   EPINEPHrine 0.3 mg/0.3 mL IJ SOAJ injection, Inject 0.3 mg into the muscle as needed for anaphylaxis., Disp: , Rfl:    nitrofurantoin (MACRODANTIN) 100 MG capsule, Take 1 capsule (100 mg total) by mouth 2 (two) times daily., Disp: 60 capsule, Rfl: 1   Allergies  Allergen Reactions   Doxycycline Nausea And Vomiting   Tetanus Toxoids Hives    ROS Review of Systems  Constitutional: Negative.   HENT: Negative.    Eyes: Negative.   Respiratory: Negative.    Cardiovascular: Negative.   Gastrointestinal: Negative.   Endocrine: Negative.   Genitourinary: Negative.   Musculoskeletal: Negative.   Skin: Negative.   Allergic/Immunologic:  Negative.   Neurological: Negative.   Hematological: Negative.   Psychiatric/Behavioral: Negative.    All other systems reviewed and are negative.     Objective:    Physical Exam Vitals reviewed.  Constitutional:      Appearance: Normal appearance.  HENT:     Mouth/Throat:     Mouth: Mucous membranes are moist.  Eyes:     Pupils: Pupils are equal, round, and reactive to light.  Neck:     Vascular: No carotid bruit.  Cardiovascular:     Rate and Rhythm: Normal rate and regular rhythm.     Pulses: Normal pulses.     Heart sounds: Normal heart sounds.  Pulmonary:     Effort: Pulmonary effort is normal.     Breath sounds: Normal breath sounds.  Abdominal:     General: Bowel sounds are normal.     Palpations: Abdomen is soft. There is no hepatomegaly, splenomegaly or mass.     Tenderness: There is no abdominal tenderness.     Hernia: No hernia is present.  Musculoskeletal:        General: No tenderness.     Cervical back: Neck supple.     Right lower leg: No edema.     Left lower leg: No edema.  Skin:    Findings: No rash.  Neurological:     Mental Status: She is alert and oriented to person, place, and time.     Motor: No weakness.  Psychiatric:        Mood and Affect: Mood and affect normal.        Behavior: Behavior normal.     BP 128/72   Pulse 68   Ht _0  (1.626 m)   Wt 137 lb 9.6 oz (62.4 kg)   BMI 23.62 kg/m  Wt Readings from Last 3 Encounters:  06/04/22 137 lb 9.6 oz (62.4 kg)  03/06/22 142 lb (64.4 kg)  02/25/22 142 lb 8 oz (64.6 kg)     Health Maintenance Due  Topic Date Due   DEXA SCAN  Never done   Zoster Vaccines- Shingrix (2 of 2) 01/26/2013   Pneumonia Vaccine 78+ Years old (2 - PCV) 07/22/2019   TETANUS/TDAP  12/29/2019   COVID-19 Vaccine (5 - Moderna risk series) 10/16/2020   INFLUENZA VACCINE  05/21/2022    There are no preventive care reminders to display for this patient.  Lab Results  Component Value Date   TSH 2.74  04/30/2022   Lab Results  Component Value Date   WBC 4.3 04/30/2022   HGB 12.2 04/30/2022   HCT 37.7 04/30/2022   MCV 91.7 04/30/2022   PLT 221 04/30/2022   Lab Results  Component Value Date   NA 140 04/30/2022   K 4.6 04/30/2022   CO2 26 04/30/2022   GLUCOSE 91 04/30/2022   BUN 11 04/30/2022   CREATININE 1.01 (H) 04/30/2022   BILITOT 0.5 04/30/2022   AST 16 04/30/2022   ALT 9 04/30/2022   PROT 6.5 04/30/2022   CALCIUM 9.4 04/30/2022   EGFR 55 (L) 04/30/2022   Lab Results  Component Value Date   CHOL 146 04/30/2022   Lab Results  Component Value Date   HDL 62 04/30/2022   Lab Results  Component Value Date   LDLCALC 69 04/30/2022   Lab Results  Component Value Date   TRIG 72 04/30/2022   Lab Results  Component Value Date   CHOLHDL 2.4 04/30/2022   No results found for: "HGBA1C"    Assessment & Plan:   Problem List Items Addressed This Visit       Digestive   Gastroesophageal reflux disease without esophagitis - Primary    - The patient's GERD is stable on medication.  - Instructed the patient to avoid eating spicy and acidic foods, as well as foods high in fat. - Instructed the patient to avoid eating large meals or meals 2-3 hours prior to sleeping.        Other   Cardiac pacemaker in situ    Patient pacemaker is at end of battery life.  Refer the patient to Dr. Quentin Ore      Anxiety     Keeping a stress/anxiety diary. This can help you learn what triggers your reaction and then learn ways to manage your response.  Thinking about how you react to certain situations. You may not be able to control everything, but you can control your response.  Making time for activities that help you relax and not feeling guilty about spending your time in this way.  Visual imagery and yoga can help you stay calm and relax.       No orders of the defined types were placed in this encounter.   Follow-up: No follow-ups on file.    Cletis Athens, MD

## 2022-06-04 NOTE — Assessment & Plan Note (Signed)
-   The patient's GERD is stable on medication.  - Instructed the patient to avoid eating spicy and acidic foods, as well as foods high in fat. - Instructed the patient to avoid eating large meals or meals 2-3 hours prior to sleeping. 

## 2022-06-05 ENCOUNTER — Encounter: Payer: Self-pay | Admitting: Cardiology

## 2022-06-05 ENCOUNTER — Encounter: Payer: Self-pay | Admitting: *Deleted

## 2022-06-05 ENCOUNTER — Ambulatory Visit (INDEPENDENT_AMBULATORY_CARE_PROVIDER_SITE_OTHER): Payer: Medicare Other | Admitting: Cardiology

## 2022-06-05 ENCOUNTER — Other Ambulatory Visit
Admission: RE | Admit: 2022-06-05 | Discharge: 2022-06-05 | Disposition: A | Discharge status: Home / Self Care | Payer: Medicare Other | Attending: Cardiology | Admitting: Cardiology

## 2022-06-05 ENCOUNTER — Telehealth: Payer: Self-pay | Admitting: Cardiology

## 2022-06-05 VITALS — BP 114/66 | HR 52 | Ht 64.0 in | Wt 139.4 lb

## 2022-06-05 DIAGNOSIS — I495 Sick sinus syndrome: Secondary | ICD-10-CM | POA: Diagnosis not present

## 2022-06-05 DIAGNOSIS — Z950 Presence of cardiac pacemaker: Secondary | ICD-10-CM

## 2022-06-05 DIAGNOSIS — I480 Paroxysmal atrial fibrillation: Secondary | ICD-10-CM | POA: Insufficient documentation

## 2022-06-05 DIAGNOSIS — Z01818 Encounter for other preprocedural examination: Secondary | ICD-10-CM

## 2022-06-05 DIAGNOSIS — Z45010 Encounter for checking and testing of cardiac pacemaker pulse generator [battery]: Secondary | ICD-10-CM | POA: Diagnosis not present

## 2022-06-05 LAB — CBC WITH DIFFERENTIAL/PLATELET
Abs Immature Granulocytes: 0.01 10*3/uL (ref 0.00–0.07)
Basophils Absolute: 0 10*3/uL (ref 0.0–0.1)
Basophils Relative: 0 %
Eosinophils Absolute: 0.1 10*3/uL (ref 0.0–0.5)
Eosinophils Relative: 1 %
HCT: 33.6 % — ABNORMAL LOW (ref 36.0–46.0)
Hemoglobin: 11 g/dL — ABNORMAL LOW (ref 12.0–15.0)
Immature Granulocytes: 0 %
Lymphocytes Relative: 30 %
Lymphs Abs: 1.8 10*3/uL (ref 0.7–4.0)
MCH: 28.9 pg (ref 26.0–34.0)
MCHC: 32.7 g/dL (ref 30.0–36.0)
MCV: 88.4 fL (ref 80.0–100.0)
Monocytes Absolute: 0.7 10*3/uL (ref 0.1–1.0)
Monocytes Relative: 11 %
Neutro Abs: 3.3 10*3/uL (ref 1.7–7.7)
Neutrophils Relative %: 58 %
Platelets: 196 10*3/uL (ref 150–400)
RBC: 3.8 MIL/uL — ABNORMAL LOW (ref 3.87–5.11)
RDW: 13.6 % (ref 11.5–15.5)
WBC: 5.8 10*3/uL (ref 4.0–10.5)
nRBC: 0 % (ref 0.0–0.2)

## 2022-06-05 LAB — BASIC METABOLIC PANEL
Anion gap: 6 (ref 5–15)
BUN: 19 mg/dL (ref 8–23)
CO2: 23 mmol/L (ref 22–32)
Calcium: 8.9 mg/dL (ref 8.9–10.3)
Chloride: 103 mmol/L (ref 98–111)
Creatinine, Ser: 1.08 mg/dL — ABNORMAL HIGH (ref 0.44–1.00)
GFR, Estimated: 50 mL/min — ABNORMAL LOW (ref >60)
Glucose, Bld: 98 mg/dL (ref 70–99)
Potassium: 4.4 mmol/L (ref 3.5–5.1)
Sodium: 132 mmol/L — ABNORMAL LOW (ref 135–145)

## 2022-06-05 NOTE — Progress Notes (Addendum)
Electrophysiology Office Note:    Date:  06/05/2022   ID:  [name], DOB 1934-06-11, MRN [id number]  PCP:  Cletis Athens, MD  [facility] Cardiologist:  None  [facility] Electrophysiologist:  Vickie Epley, MD   Referring MD: Cletis Athens, MD   Chief Complaint: Pacemaker at ERI  History of Present Illness:    Jessica Browning is a 88 y.o. female who presents for an evaluation of pacemaker at the request of Dr. Rebecka Apley. Their medical history includes hypertension, anxiety, permanent pacemaker in situ.  The patient last saw Dr. Rebecka Apley in clinic May 27, 2022.  At that appointment it was noted that the pacemaker had reached ERI on May 10, 2022.  According to his office check the patient paces less than 0.1% while programmed VVI 65.  Past interrogations have shown a high burden of atrial pacing.  the pacemaker was implanted August 21, 2011.  It is a dual-chamber permanent pacemaker.     Past Medical History:  Diagnosis Date   Basal cell carcinoma 06/18/2016   Posterior neck. Nodular pattern.   Hypertension     Past Surgical History:  Procedure Laterality Date   ABDOMINAL HYSTERECTOMY     BLADDER SURGERY     VEIN LIGATION AND STRIPPING     bilateral legs    Current Medications: No outpatient medications have been marked as taking for the 06/05/22 encounter (Office Visit) with Vickie Epley, MD.     Allergies:   Doxycycline and Tetanus toxoids   Social History   Socioeconomic History   Marital status: Single    Spouse name: Not on file   Number of children: Not on file   Years of education: Not on file   Highest education level: Not on file  Occupational History   Not on file  Tobacco Use   Smoking status: Never   Smokeless tobacco: Never  Substance and Sexual Activity   Alcohol use: Never   Drug use: Never   Sexual activity: Not Currently  Other Topics Concern   Not on file  Social History Narrative   Not on file   Social  Determinants of Health   Financial Resource Strain: Low Risk  (07/12/2021)   Overall Financial Resource Strain (CARDIA)    Difficulty of Paying Living Expenses: Not very hard  Food Insecurity: No Food Insecurity (07/12/2021)   Hunger Vital Sign    Worried About Running Out of Food in the Last Year: Never true    Ran Out of Food in the Last Year: Never true  Transportation Needs: No Transportation Needs (07/12/2021)   PRAPARE - Hydrologist (Medical): No    Lack of Transportation (Non-Medical): No  Physical Activity: Insufficiently Active (07/12/2021)   Exercise Vital Sign    Days of Exercise per Week: 2 days    Minutes of Exercise per Session: 20 min  Stress: No Stress Concern Present (07/12/2021)   [place]    Feeling of Stress : Only a little  Social Connections: Moderately Integrated (07/12/2021)   Social Connection and Isolation Panel [NHANES]    Frequency of Communication with Friends and Family: More than three times a week    Frequency of Social Gatherings with Friends and Family: Three times a week    Attends Religious Services: 1 to 4 times per year    Active Member of Clubs or Organizations: Yes    Attends Archivist  Meetings: 1 to 4 times per year    Marital Status: Widowed     Family History: The patient's family history is not on file.  ROS:   Please see the history of present illness.    All other systems reviewed and are negative.  EKGs/Labs/Other Studies Reviewed:    The following studies were reviewed today:  June 05, 2022 in clinic device interrogation personally reviewed Battery status ERI Lead parameters stable on last interrogation     Recent Labs: 04/30/2022: ALT 9; TSH 2.74 06/05/2022: BUN 19; Creatinine, Ser 1.08; Hemoglobin 11.0; Platelets 196; Potassium 4.4; Sodium 132  Recent Lipid Panel    Component Value Date/Time   CHOL 146 04/30/2022  1639   TRIG 72 04/30/2022 1639   HDL 62 04/30/2022 1639   CHOLHDL 2.4 04/30/2022 1639   LDLCALC 69 04/30/2022 1639    Physical Exam:    VS:  BP 114/66   Pulse (!) 52   Ht '5\' 4"'$  (1.626 m)   Wt 139 lb 6.4 oz (63.2 kg)   SpO2 97%   BMI 23.93 kg/m     Wt Readings from Last 3 Encounters:  06/05/22 139 lb 6.4 oz (63.2 kg)  06/04/22 137 lb 9.6 oz (62.4 kg)  03/06/22 142 lb (64.4 kg)     GEN:  Well nourished, well developed in no acute distress.  Elderly HEENT: Normal NECK: No JVD; No carotid bruits LYMPHATICS: No lymphadenopathy CARDIAC: RRR, no murmurs, rubs, gallops.  Pacemaker pocket well-healed.  Generator has migrated inferiorly into the breast tissue. RESPIRATORY:  Clear to auscultation without rales, wheezing or rhonchi  ABDOMEN: Soft, non-tender, non-distended MUSCULOSKELETAL:  No edema; No deformity  SKIN: Warm and dry NEUROLOGIC:  Alert and oriented x 3 PSYCHIATRIC:  Normal affect       ASSESSMENT:    1. Cardiac pacemaker in situ   2. Pacemaker at end of battery life   3. Sick sinus syndrome ([place])   4. Pre-op evaluation    PLAN:    In order of problems listed above:  #Pacemaker in situ #Sick sinus syndrome #Pacemaker ERI The patient requires a high burden of atrial pacing given a history of sick sinus syndrome.  Her pacemaker generator has reached ERI and will require replacement.  I discussed the generator replacement procedure in detail with the patient and her family and they wish to proceed with scheduling.  She will need to hold her aspirin for 5 days prior to the procedure.  Risks, benefits, and alternatives to pulse generator replacement were discussed in detail today.  The patient understands that risks include but are not limited to bleeding, infection, pneumothorax, perforation, tamponade, vascular damage, renal failure, MI, stroke, death, inappropriate shocks, damage to his existing leads, and lead dislodgement and wishes to proceed.  We will  therefore schedule the procedure at the next available time.  At the time of generator replacement, we will plan for pocket revision as well.   Medication Adjustments/Labs and Tests Ordered: Current medicines are reviewed at length with the patient today.  Concerns regarding medicines are outlined above.  No orders of the defined types were placed in this encounter.  No orders of the defined types were placed in this encounter.    Signed, Hilton Cork. Quentin Ore, MD, [facility], [facility] 06/05/2022 7:34 PM    Electrophysiology [facility]

## 2022-06-05 NOTE — Telephone Encounter (Signed)
[facility] called stating that pt is there for labs however, the orders have been cancelled. They ask that you please call pt back to clarify.

## 2022-06-05 NOTE — Telephone Encounter (Signed)
Looked at patient's chart, appears as though lab orders for BMP and CBC were not placed.  [facility], patient had already left.   Attempted to call patient, no answer, left message letting her know that orders had been placed and that she could go to the [facility] when it was convenient for her.   Will forward to MD's nurse as Juluis Rainier.

## 2022-06-05 NOTE — Patient Instructions (Signed)
Medication Instructions:  none *If you need a refill on your cardiac medications before your next appointment, please call your pharmacy*   Lab Work: Today BMP, CBC Nature conservation officer at [facility] 1st desk on the right to check in Lab hours: 7:30 am- 5:30 pm (walk in basis)  If you have labs (blood work) drawn today and your tests are completely normal, you will receive your results only by: MyChart Message (if you have MyChart) OR A paper copy in the mail If you have any lab test that is abnormal or we need to change your treatment, we will call you to review the results.   Testing/Procedures: Your physician has recommended that you have a pacemaker Battery Change. A pacemaker is a small device that is placed under the skin of your chest or abdomen to help control abnormal heart rhythms. This device uses electrical pulses to prompt the heart to beat at a normal rate. Pacemakers are used to treat heart rhythms that are too slow. Wire (leads) are attached to the pacemaker that goes into the chambers of you heart. This is done in the hospital and usually requires and overnight stay. Please see the instruction sheet given to you today for more information.    Follow-Up: At [facility], you and your health needs are our priority.  As part of our continuing mission to provide you with exceptional heart care, we have created designated Provider Care Teams.  These Care Teams include your primary Cardiologist (physician) and Advanced Practice Providers (APPs -  Physician Assistants and Nurse Practitioners) who all work together to provide you with the care you need, when you need it.  We recommend signing up for the patient portal called "MyChart".  Sign up information is provided on this After Visit Summary.  MyChart is used to connect with patients for Virtual Visits (Telemedicine).  Patients are able to view lab/test results, encounter notes, upcoming appointments, etc.  Non-urgent messages can be  sent to your provider as well.   To learn more about what you can do with MyChart, go to [email or web address].    Your next appointment:   See instruction letter.1}    Other Instructions Pacemaker Battery Change  A pacemaker battery usually lasts 5-15 years (6-7 years on average). A few times a year, you may be asked to visit your health care provider to have a full evaluation of your pacemaker. When the battery is low, your pacemaker will be completely replaced. Most often, this procedure is simpler than the first surgery because the wires (leads) that connect the pacemaker to the heart are already in place. There are many things that affect how long a pacemaker battery will last, including: The age of the pacemaker. The number of leads you have(1, 2, or 3). The use or workload of the pacemaker. If the pacemaker is helping the heart more often, the battery will not last as long. Power (voltage) settings. Tell a health care provider about: Any allergies you have. All medicines you are taking, including vitamins, herbs, eye drops, creams, and over-the-counter medicines. Any problems you or family members have had with anesthetic medicines. Any blood disorders you have. Any surgeries you have had, especially any surgeries you have had since your last pacemaker was placed. Any medical conditions you have. Whether you are pregnant or may be pregnant. What are the risks? Generally, this is a safe procedure. However, problems may occur, including: Bleeding. Infection. Nerve damage. Allergic reaction to medicines. Damage to the leads that  go to the heart. What happens before the procedure? Staying hydrated Follow instructions from your health care provider about hydration, which may include: Up to 2 hours before the procedure - you may continue to drink clear liquids, such as water, clear fruit juice, black coffee, and plain tea. Eating and drinking restrictions Follow instructions  from your health care provider about eating and drinking restrictions, which may include: 8 hours before the procedure - stop eating heavy meals or foods, such as meat, fried foods, or fatty foods. 6 hours before the procedure - stop eating light meals or foods, such as toast or cereal. 6 hours before the procedure - stop drinking milk or drinks that contain milk. 2 hours before the procedure - stop drinking clear liquids. Medicines Ask your health care provider about: Changing or stopping your regular medicines. This is especially important if you are taking diabetes medicines or blood thinners. Taking medicines such as aspirin and ibuprofen. These medicines can thin your blood. Do not take these medicines unless your health care provider tells you to take them. Taking over-the-counter medicines, vitamins, herbs, and supplements. General instructions Ask your health care provider what steps will be taken to help prevent infection. These may include: Removing hair at the surgery site. Washing skin with a germ-killing soap. Receiving antibiotic medicine. Plan to have someone take you home from the hospital or clinic. If you will be going home right after the procedure, plan to have someone with you for 24 hours. What happens during the procedure? An IV will be inserted into one of your veins. You will be given one or more of the following: A medicine to help you relax (sedative). A medicine to numb the area where the pacemaker is located (local anesthetic). Your health care provider will make an incision to reopen the pocket holding the pacemaker. The old pacemaker will be disconnected from the leads. The leads will be tested. If needed, the leads will be replaced. If the leads are functioning properly, the new pacemaker will be connected to the existing leads. A heart monitor and a pacemaker programmer will be used to make sure that the newly implanted pacemaker is working properly. The  incision site will be closed with stitches (sutures), adhesive strips, or skin glue. A bandage (dressing) will be placed over the pacemaker site. The procedure may vary among health care providers and hospitals. What happens after the procedure? Your blood pressure, heart rate, breathing rate, and blood oxygen level will be monitored until you leave the hospital or clinic. You may be given antibiotics. Your health care provider will tell you when your pacemaker will need to be tested again, or when to return to the office for removal of the dressing and sutures. If you were given a sedative during the procedure, it can affect you for several hours. Do not drive or operate machinery until your health care provider says that it is safe. You will be given a pacemaker identification card. This card lists the implant date, device model, and manufacturer of your pacemaker. Summary A pacemaker battery usually lasts 5-15 years (6-7 years on average). When the battery is low, your pacemaker will need to be replaced. Most often, this procedure is simpler than the first surgery because the wires (leads) that connect the pacemaker to the heart are already in place. Risks of this procedure include bleeding, infection, and allergic reactions to medicines. This information is not intended to replace advice given to you by your health care provider.  Make sure you discuss any questions you have with your health care provider. Document Revised: 09/09/2019 Document Reviewed: 09/09/2019 Elsevier Patient Education  [place]

## 2022-06-05 NOTE — H&P (View-Only) (Signed)
Electrophysiology Office Note:    Date:  06/05/2022   ID:  Jessica Browning, DOB Apr 29, 1934, MRN [id number]  PCP:  Cletis Athens, MD  [facility] Cardiologist:  None  [facility] Electrophysiologist:  Vickie Epley, MD   Referring MD: Cletis Athens, MD   Chief Complaint: Pacemaker at ERI  History of Present Illness:    Jessica Browning is a 88 y.o. female who presents for an evaluation of pacemaker at the request of Dr. Rebecka Apley. Their medical history includes hypertension, anxiety, permanent pacemaker in situ.  The patient last saw Dr. Rebecka Apley in clinic May 27, 2022.  At that appointment it was noted that the pacemaker had reached ERI on May 10, 2022.  According to his office check the patient paces less than 0.1% while programmed VVI 65.  Past interrogations have shown a high burden of atrial pacing.  the pacemaker was implanted August 21, 2011.  It is a dual-chamber permanent pacemaker.     Past Medical History:  Diagnosis Date   Basal cell carcinoma 06/18/2016   Posterior neck. Nodular pattern.   Hypertension     Past Surgical History:  Procedure Laterality Date   ABDOMINAL HYSTERECTOMY     BLADDER SURGERY     VEIN LIGATION AND STRIPPING     bilateral legs    Current Medications: No outpatient medications have been marked as taking for the 06/05/22 encounter (Office Visit) with Vickie Epley, MD.     Allergies:   Doxycycline and Tetanus toxoids   Social History   Socioeconomic History   Marital status: Single    Spouse name: Not on file   Number of children: Not on file   Years of education: Not on file   Highest education level: Not on file  Occupational History   Not on file  Tobacco Use   Smoking status: Never   Smokeless tobacco: Never  Substance and Sexual Activity   Alcohol use: Never   Drug use: Never   Sexual activity: Not Currently  Other Topics Concern   Not on file  Social History Narrative   Not on file   Social  Determinants of Health   Financial Resource Strain: Low Risk  (07/12/2021)   Overall Financial Resource Strain (CARDIA)    Difficulty of Paying Living Expenses: Not very hard  Food Insecurity: No Food Insecurity (07/12/2021)   Hunger Vital Sign    Worried About Running Out of Food in the Last Year: Never true    Ran Out of Food in the Last Year: Never true  Transportation Needs: No Transportation Needs (07/12/2021)   PRAPARE - Hydrologist (Medical): No    Lack of Transportation (Non-Medical): No  Physical Activity: Insufficiently Active (07/12/2021)   Exercise Vital Sign    Days of Exercise per Week: 2 days    Minutes of Exercise per Session: 20 min  Stress: No Stress Concern Present (07/12/2021)   [place]    Feeling of Stress : Only a little  Social Connections: Moderately Integrated (07/12/2021)   Social Connection and Isolation Panel [NHANES]    Frequency of Communication with Friends and Family: More than three times a week    Frequency of Social Gatherings with Friends and Family: Three times a week    Attends Religious Services: 1 to 4 times per year    Active Member of Clubs or Organizations: Yes    Attends Archivist  Meetings: 1 to 4 times per year    Marital Status: Widowed     Family History: The patient's family history is not on file.  ROS:   Please see the history of present illness.    All other systems reviewed and are negative.  EKGs/Labs/Other Studies Reviewed:    The following studies were reviewed today:  June 05, 2022 in clinic device interrogation personally reviewed Battery status ERI Lead parameters stable on last interrogation     Recent Labs: 04/30/2022: ALT 9; TSH 2.74 06/05/2022: BUN 19; Creatinine, Ser 1.08; Hemoglobin 11.0; Platelets 196; Potassium 4.4; Sodium 132  Recent Lipid Panel    Component Value Date/Time   CHOL 146 04/30/2022  1639   TRIG 72 04/30/2022 1639   HDL 62 04/30/2022 1639   CHOLHDL 2.4 04/30/2022 1639   LDLCALC 69 04/30/2022 1639    Physical Exam:    VS:  BP 114/66   Pulse (!) 52   Ht '5\' 4"'$  (1.626 m)   Wt 139 lb 6.4 oz (63.2 kg)   SpO2 97%   BMI 23.93 kg/m     Wt Readings from Last 3 Encounters:  06/05/22 139 lb 6.4 oz (63.2 kg)  06/04/22 137 lb 9.6 oz (62.4 kg)  03/06/22 142 lb (64.4 kg)     GEN:  Well nourished, well developed in no acute distress.  Elderly HEENT: Normal NECK: No JVD; No carotid bruits LYMPHATICS: No lymphadenopathy CARDIAC: RRR, no murmurs, rubs, gallops.  Pacemaker pocket well-healed.  Generator has migrated inferiorly into the breast tissue. RESPIRATORY:  Clear to auscultation without rales, wheezing or rhonchi  ABDOMEN: Soft, non-tender, non-distended MUSCULOSKELETAL:  No edema; No deformity  SKIN: Warm and dry NEUROLOGIC:  Alert and oriented x 3 PSYCHIATRIC:  Normal affect       ASSESSMENT:    1. Cardiac pacemaker in situ   2. Pacemaker at end of battery life   3. Sick sinus syndrome ([place])   4. Pre-op evaluation    PLAN:    In order of problems listed above:  #Pacemaker in situ #Sick sinus syndrome #Pacemaker ERI The patient requires a high burden of atrial pacing given a history of sick sinus syndrome.  Her pacemaker generator has reached ERI and will require replacement.  I discussed the generator replacement procedure in detail with the patient and her family and they wish to proceed with scheduling.  She will need to hold her aspirin for 5 days prior to the procedure.  Risks, benefits, and alternatives to pulse generator replacement were discussed in detail today.  The patient understands that risks include but are not limited to bleeding, infection, pneumothorax, perforation, tamponade, vascular damage, renal failure, MI, stroke, death, inappropriate shocks, damage to his existing leads, and lead dislodgement and wishes to proceed.  We will  therefore schedule the procedure at the next available time.  At the time of generator replacement, we will plan for pocket revision as well.   Medication Adjustments/Labs and Tests Ordered: Current medicines are reviewed at length with the patient today.  Concerns regarding medicines are outlined above.  No orders of the defined types were placed in this encounter.  No orders of the defined types were placed in this encounter.    Signed, Hilton Cork. Quentin Ore, MD, [facility], [facility] 06/05/2022 7:34 PM    Electrophysiology [facility]

## 2022-06-10 ENCOUNTER — Telehealth: Payer: Self-pay | Admitting: *Deleted

## 2022-06-10 ENCOUNTER — Other Ambulatory Visit: Payer: Self-pay | Admitting: *Deleted

## 2022-06-10 MED ORDER — AMLODIPINE BESYLATE 5 MG PO TABS: 5.0000 mg | ORAL_TABLET | Freq: Every day | ORAL | 3 refills | Status: DC

## 2022-06-10 MED ORDER — ATORVASTATIN CALCIUM 10 MG PO TABS: 10.0000 mg | ORAL_TABLET | Freq: Every day | ORAL | 3 refills | Status: DC

## 2022-06-10 NOTE — Telephone Encounter (Signed)
Left message to call back  

## 2022-06-10 NOTE — Telephone Encounter (Signed)
New arrival time for Aug 24 procedure 8:30 am. Pam verbalized understanding with read back.

## 2022-06-10 NOTE — Telephone Encounter (Signed)
Pt's daughter is returning call. Transferred to RN, Otila Kluver.

## 2022-06-11 ENCOUNTER — Other Ambulatory Visit: Payer: Self-pay | Admitting: Internal Medicine

## 2022-06-11 ENCOUNTER — Other Ambulatory Visit: Payer: Self-pay | Admitting: Cardiology

## 2022-06-11 DIAGNOSIS — F419 Anxiety disorder, unspecified: Secondary | ICD-10-CM

## 2022-06-12 NOTE — Pre-Procedure Instructions (Signed)
Called patient to go over procedure instructions for tomorrow.  Spoke with her daughter Olin Hauser, who will be bringing her tomorrow.  Informed her that procedure time is now 12:30 and needs to be here at 10:30.  Instructed patient on the following items: Arrival time 1030 Nothing to eat or drink after midnight No meds AM of procedure Responsible person to drive you home and stay with you for 24 hrs Wash with special soap night before and morning of procedure If on anti-coagulant drug instructions Aspirin last dose 8/18

## 2022-06-13 ENCOUNTER — Ambulatory Visit (HOSPITAL_COMMUNITY)
Admission: RE | Admit: 2022-06-13 | Discharge: 2022-06-13 | Disposition: A | Discharge status: Home / Self Care | Payer: Medicare Other | Attending: Cardiology | Admitting: Cardiology

## 2022-06-13 ENCOUNTER — Other Ambulatory Visit: Payer: Self-pay

## 2022-06-13 ENCOUNTER — Ambulatory Visit (HOSPITAL_COMMUNITY)
Admission: RE | Disposition: A | Discharge status: Home / Self Care | Payer: Self-pay | Source: Home / Self Care | Attending: Cardiology

## 2022-06-13 DIAGNOSIS — F419 Anxiety disorder, unspecified: Secondary | ICD-10-CM | POA: Insufficient documentation

## 2022-06-13 DIAGNOSIS — I10 Essential (primary) hypertension: Secondary | ICD-10-CM | POA: Insufficient documentation

## 2022-06-13 DIAGNOSIS — R001 Bradycardia, unspecified: Secondary | ICD-10-CM | POA: Diagnosis not present

## 2022-06-13 DIAGNOSIS — Z45010 Encounter for checking and testing of cardiac pacemaker pulse generator [battery]: Secondary | ICD-10-CM | POA: Insufficient documentation

## 2022-06-13 DIAGNOSIS — I495 Sick sinus syndrome: Secondary | ICD-10-CM | POA: Insufficient documentation

## 2022-06-13 HISTORY — PX: BIV PACEMAKER GENERATOR CHANGEOUT: EP1198

## 2022-06-13 SURGERY — BIV PACEMAKER GENERATOR CHANGEOUT
Anesthesia: LOCAL

## 2022-06-13 MED ORDER — SODIUM CHLORIDE 0.9 % IV SOLN: INTRAVENOUS | Status: DC

## 2022-06-13 MED ORDER — CHLORHEXIDINE GLUCONATE 4 % EX LIQD
4.0000 | Freq: Once | CUTANEOUS | Status: DC
  Filled 2022-06-13: qty 60

## 2022-06-13 MED ORDER — ONDANSETRON HCL 4 MG/2ML IJ SOLN: 4.0000 mg | Freq: Four times a day (QID) | INTRAMUSCULAR | Status: DC | PRN

## 2022-06-13 MED ORDER — LIDOCAINE HCL (PF) 1 % IJ SOLN
INTRAMUSCULAR | Status: DC | PRN
  Administered 2022-06-13: 60 mL

## 2022-06-13 MED ORDER — MIDAZOLAM HCL 5 MG/5ML IJ SOLN
INTRAMUSCULAR | Status: DC | PRN
  Administered 2022-06-13: 1 mg via INTRAVENOUS

## 2022-06-13 MED ORDER — SODIUM CHLORIDE 0.9 % IV SOLN
INTRAVENOUS | Status: AC
  Filled 2022-06-13: qty 2

## 2022-06-13 MED ORDER — SODIUM CHLORIDE 0.9 % IV SOLN: 80.0000 mg | INTRAVENOUS | Status: DC

## 2022-06-13 MED ORDER — ACETAMINOPHEN 325 MG PO TABS: 325.0000 mg | ORAL_TABLET | ORAL | Status: DC | PRN

## 2022-06-13 MED ORDER — MIDAZOLAM HCL 5 MG/5ML IJ SOLN
INTRAMUSCULAR | Status: AC
  Filled 2022-06-13: qty 5

## 2022-06-13 MED ORDER — CEFAZOLIN SODIUM-DEXTROSE 2-4 GM/100ML-% IV SOLN
INTRAVENOUS | Status: AC
  Filled 2022-06-13: qty 100

## 2022-06-13 MED ORDER — POVIDONE-IODINE 10 % EX SWAB: 2.0000 | Freq: Once | CUTANEOUS | Status: DC

## 2022-06-13 MED ORDER — FENTANYL CITRATE (PF) 100 MCG/2ML IJ SOLN
INTRAMUSCULAR | Status: AC
  Filled 2022-06-13: qty 2

## 2022-06-13 MED ORDER — CEFAZOLIN SODIUM-DEXTROSE 2-4 GM/100ML-% IV SOLN
2.0000 g | INTRAVENOUS | Status: AC
  Administered 2022-06-13: 2 g via INTRAVENOUS

## 2022-06-13 MED ORDER — LIDOCAINE HCL (PF) 1 % IJ SOLN
INTRAMUSCULAR | Status: AC
  Filled 2022-06-13: qty 90

## 2022-06-13 MED ORDER — FENTANYL CITRATE (PF) 100 MCG/2ML IJ SOLN
INTRAMUSCULAR | Status: DC | PRN
  Administered 2022-06-13: 25 ug via INTRAVENOUS

## 2022-06-13 SURGICAL SUPPLY — 7 items
CABLE SURGICAL S-101-97-12 (CABLE) ×2 IMPLANT
IPG PACE AZUR XT DR MRI W1DR01 (Pacemaker) IMPLANT
PACE AZURE XT DR MRI W1DR01 (Pacemaker) ×1 IMPLANT
PAD DEFIB RADIO PHYSIO CONN (PAD) ×2 IMPLANT
POUCH AIGIS-R ANTIBACT PPM (Mesh General) ×1 IMPLANT
POUCH AIGIS-R ANTIBACT PPM MED (Mesh General) IMPLANT
TRAY PACEMAKER INSERTION (PACKS) ×2 IMPLANT

## 2022-06-13 NOTE — Interval H&P Note (Signed)
History and Physical Interval Note:  06/13/2022 12:16 PM  Jessica Browning  has presented today for surgery, with the diagnosis of eri.  The various methods of treatment have been discussed with the patient and family. After consideration of risks, benefits and other options for treatment, the patient has consented to  Procedure(s): [place] (N/A) as a surgical intervention.  The patient's history has been reviewed, patient examined, no change in status, stable for surgery.  I have reviewed the patient's chart and labs.  Questions were answered to the patient's satisfaction.     Sherissa Tenenbaum T Taiga Lupinacci

## 2022-06-13 NOTE — Discharge Instructions (Signed)

## 2022-06-14 ENCOUNTER — Encounter (HOSPITAL_COMMUNITY): Payer: Self-pay | Admitting: Cardiology

## 2022-06-14 MED FILL — Gentamicin Sulfate Inj 40 MG/ML: INTRAMUSCULAR | Qty: 80 | Status: AC

## 2022-06-26 ENCOUNTER — Telehealth: Payer: Self-pay

## 2022-06-26 ENCOUNTER — Ambulatory Visit: Payer: Medicare Other | Attending: Cardiology

## 2022-06-26 DIAGNOSIS — Z950 Presence of cardiac pacemaker: Secondary | ICD-10-CM | POA: Diagnosis not present

## 2022-06-26 DIAGNOSIS — I495 Sick sinus syndrome: Secondary | ICD-10-CM | POA: Insufficient documentation

## 2022-06-26 DIAGNOSIS — R079 Chest pain, unspecified: Secondary | ICD-10-CM | POA: Insufficient documentation

## 2022-06-26 LAB — CUP PACEART INCLINIC DEVICE CHECK
Battery Remaining Longevity: 159 mo
Battery Voltage: 3.23 V
Brady Statistic AP VP Percent: 0 %
Brady Statistic AP VS Percent: 99.18 %
Brady Statistic AS VP Percent: 0 %
Brady Statistic AS VS Percent: 0.81 %
Brady Statistic RA Percent Paced: 99.13 %
Brady Statistic RV Percent Paced: 0 %
Date Time Interrogation Session: 20230906095900
Implantable Lead Implant Date: 20121031
Implantable Lead Implant Date: 20121031
Implantable Lead Location: 753859
Implantable Lead Location: 753860
Implantable Lead Model: 5092
Implantable Lead Model: 5592
Implantable Pulse Generator Implant Date: 20230824
Lead Channel Impedance Value: 342 Ohm
Lead Channel Impedance Value: 399 Ohm
Lead Channel Impedance Value: 418 Ohm
Lead Channel Impedance Value: 551 Ohm
Lead Channel Pacing Threshold Amplitude: 0.625 V
Lead Channel Pacing Threshold Pulse Width: 0.4 ms
Lead Channel Sensing Intrinsic Amplitude: 3.75 mV
Lead Channel Sensing Intrinsic Amplitude: 4.125 mV
Lead Channel Sensing Intrinsic Amplitude: 6.125 mV
Lead Channel Sensing Intrinsic Amplitude: 6.875 mV
Lead Channel Setting Pacing Amplitude: 2 V
Lead Channel Setting Pacing Amplitude: 3.5 V
Lead Channel Setting Pacing Pulse Width: 0.4 ms
Lead Channel Setting Sensing Sensitivity: 0.9 mV

## 2022-06-26 NOTE — Patient Instructions (Signed)

## 2022-06-26 NOTE — Telephone Encounter (Signed)
Outreach made to Pt's daughter.  Advised Dr. Quentin Ore would like an echo to evaluate chest pain and orthopnea.  Follow up to be based on results of Echo.

## 2022-06-26 NOTE — Progress Notes (Signed)
Wound check appointment. Steri-strips removed. Wound without redness or edema. Incision edges approximated, wound well healed. Normal device function. Thresholds, sensing, and impedances consistent with implant measurements. Device programmed at 3.5V/auto capture programmed on for extra safety margin until 3 month visit. Histogram distribution appropriate for patient and level of activity. No mode switches or high ventricular rates noted. Patient educated about wound care, arm mobility, lifting restrictions. ROV in 3 months with implanting physician.  During wound check appointment Pt complained of chest pain across her chest that happened a few nights ago.  She has also had increased indigestion symptoms (known history of GERD) and also states she has had to sleep on 2 pillows at night.  Spoke with Dr. Jennette Kettle office.  Agrees to further testing by [facility] cardiology as needed.   Discussed with Dr. Quentin Ore.  Will order echocardiogram to evaluate reported chest pain and follow up based on results.

## 2022-07-09 ENCOUNTER — Ambulatory Visit (HOSPITAL_COMMUNITY): Payer: Medicare Other | Attending: Cardiology

## 2022-07-09 DIAGNOSIS — R079 Chest pain, unspecified: Secondary | ICD-10-CM | POA: Diagnosis not present

## 2022-07-09 LAB — ECHOCARDIOGRAM COMPLETE
Area-P 1/2: 3.39 cm2
S' Lateral: 2.5 cm

## 2022-07-11 ENCOUNTER — Telehealth: Payer: Self-pay

## 2022-07-11 NOTE — Telephone Encounter (Signed)
Outreach made to Pt's daughter Pam per Pt request.  Advised ok per Dr. Quentin Ore to follow with him for pacemaker follow up.  Pt added to [facility].  Advised to plug in monitor for remote monitoring.  Remote check scheduled for October 5.  Will follow to ensure connectivity.

## 2022-07-24 ENCOUNTER — Encounter: Payer: Self-pay | Admitting: *Deleted

## 2022-07-25 ENCOUNTER — Ambulatory Visit (INDEPENDENT_AMBULATORY_CARE_PROVIDER_SITE_OTHER): Payer: Medicare Other

## 2022-07-25 DIAGNOSIS — I495 Sick sinus syndrome: Secondary | ICD-10-CM | POA: Diagnosis not present

## 2022-07-26 LAB — CUP PACEART REMOTE DEVICE CHECK
Battery Remaining Longevity: 163 mo
Battery Voltage: 3.22 V
Brady Statistic AP VP Percent: 0 %
Brady Statistic AP VS Percent: 96.29 %
Brady Statistic AS VP Percent: 0 %
Brady Statistic AS VS Percent: 3.71 %
Brady Statistic RA Percent Paced: 96.23 %
Brady Statistic RV Percent Paced: 0 %
Date Time Interrogation Session: 20231005065516
Implantable Lead Implant Date: 20121031
Implantable Lead Implant Date: 20121031
Implantable Lead Location: 753859
Implantable Lead Location: 753860
Implantable Lead Model: 5092
Implantable Lead Model: 5592
Implantable Pulse Generator Implant Date: 20230824
Lead Channel Impedance Value: 323 Ohm
Lead Channel Impedance Value: 380 Ohm
Lead Channel Impedance Value: 380 Ohm
Lead Channel Impedance Value: 475 Ohm
Lead Channel Pacing Threshold Amplitude: 0.5 V
Lead Channel Pacing Threshold Pulse Width: 0.4 ms
Lead Channel Sensing Intrinsic Amplitude: 3 mV
Lead Channel Sensing Intrinsic Amplitude: 3 mV
Lead Channel Sensing Intrinsic Amplitude: 5.5 mV
Lead Channel Sensing Intrinsic Amplitude: 5.5 mV
Lead Channel Setting Pacing Amplitude: 1.5 V
Lead Channel Setting Pacing Amplitude: 3.5 V
Lead Channel Setting Pacing Pulse Width: 0.4 ms
Lead Channel Setting Sensing Sensitivity: 0.9 mV

## 2022-08-01 NOTE — Progress Notes (Signed)
Remote pacemaker transmission.   

## 2022-08-06 ENCOUNTER — Encounter: Payer: Self-pay | Admitting: *Deleted

## 2022-08-09 DIAGNOSIS — J3089 Other allergic rhinitis: Secondary | ICD-10-CM | POA: Diagnosis not present

## 2022-08-09 DIAGNOSIS — Z91018 Allergy to other foods: Secondary | ICD-10-CM | POA: Diagnosis not present

## 2022-08-09 DIAGNOSIS — J3081 Allergic rhinitis due to animal (cat) (dog) hair and dander: Secondary | ICD-10-CM | POA: Diagnosis not present

## 2022-08-12 ENCOUNTER — Other Ambulatory Visit: Payer: Self-pay | Admitting: *Deleted

## 2022-08-12 DIAGNOSIS — F419 Anxiety disorder, unspecified: Secondary | ICD-10-CM

## 2022-08-12 MED ORDER — EPINEPHRINE 0.3 MG/0.3ML IJ SOAJ: 0.3000 mg | INTRAMUSCULAR | 6 refills | Status: AC | PRN

## 2022-08-12 MED ORDER — ALPRAZOLAM 0.25 MG PO TABS: 0.2500 mg | ORAL_TABLET | Freq: Two times a day (BID) | ORAL | 0 refills | Status: DC

## 2022-08-19 ENCOUNTER — Ambulatory Visit (INDEPENDENT_AMBULATORY_CARE_PROVIDER_SITE_OTHER): Payer: Medicare Other | Admitting: Internal Medicine

## 2022-08-19 ENCOUNTER — Encounter: Payer: Self-pay | Admitting: Internal Medicine

## 2022-08-19 VITALS — BP 134/74 | HR 63 | Temp 98.1°F | Resp 16 | Ht 62.0 in | Wt 142.5 lb

## 2022-08-19 DIAGNOSIS — F419 Anxiety disorder, unspecified: Secondary | ICD-10-CM

## 2022-08-19 DIAGNOSIS — E782 Mixed hyperlipidemia: Secondary | ICD-10-CM | POA: Diagnosis not present

## 2022-08-19 DIAGNOSIS — I10 Essential (primary) hypertension: Secondary | ICD-10-CM | POA: Diagnosis not present

## 2022-08-19 DIAGNOSIS — Z23 Encounter for immunization: Secondary | ICD-10-CM | POA: Diagnosis not present

## 2022-08-19 MED ORDER — BUSPIRONE HCL 10 MG PO TABS: 10.0000 mg | ORAL_TABLET | Freq: Every day | ORAL | 1 refills | Status: DC

## 2022-08-19 NOTE — Patient Instructions (Signed)
It was great seeing you today!  Plan discussed at today's visit: -Cut Xanax in half for 2 weeks, can take Buspar at the same time (entire tablet), then take 1/4 of a Xanax with 1 entire Buspar tablet -Let me know if you have any questions or problems with this   Follow up in: 1 month   Take care and let us know if you have any questions or concerns prior to your next visit.  Dr. Rosana Berger

## 2022-08-19 NOTE — Progress Notes (Signed)
New Patient Office Visit  Subjective    Patient ID: Jessica Browning, female    DOB: [birth year]  Age: 88 y.o. MRN: [id number]  CC:  Chief Complaint  Patient presents with   Establish Care   Depression    Wants to see about getting on another anxiety    HPI Jessica Browning presents to establish care.  Hypertension/Sinus Sick Syndrome: -Medications: Amlodipine 5 mg -Patient is compliant with above medications and reports no side effects. -Checking BP at home (average): doesn't check -Denies any SOB, CP, vision changes, LE edema or symptoms of hypotension -Following with Cardiology, last note reviewed from 06/05/22. Pacemaker placed in August   HLD: -Medications: Lipitor 10 mg, aspirin 81 mg -Patient is compliant with above medications and reports no side effects.  -Last lipid panel: Lipid Panel     Component Value Date/Time   CHOL 146 04/30/2022 1639   TRIG 72 04/30/2022 1639   HDL 62 04/30/2022 1639   CHOLHDL 2.4 04/30/2022 1639   LDLCALC 69 04/30/2022 1639   Anxiety: -Has been on Xanax 0.25 mg before bed every day for the last 20+ years, she has not been on anything else for anxiety.     08/19/2022    1:59 PM 07/12/2021   12:35 PM 07/12/2021   12:29 PM 04/30/2021    8:54 AM 06/29/2020    9:32 AM  Depression screen PHQ 2/9  Decreased Interest 0 0 0 0 0  Down, Depressed, Hopeless 0 0 0 0 0  PHQ - 2 Score 0 0 0 0 0  Altered sleeping 0      Tired, decreased energy 1      Change in appetite 0      Feeling bad or failure about yourself  0      Trouble concentrating 3      Moving slowly or fidgety/restless 0      Suicidal thoughts 0      PHQ-9 Score 4      Difficult doing work/chores Not difficult at all       Health maintenance: -Blood work up-to-date   Outpatient Encounter Medications as of 08/19/2022  Medication Sig   ALPRAZolam (XANAX) 0.25 MG tablet Take 1 tablet (0.25 mg total) by mouth 2 (two) times daily.   amLODipine (NORVASC) 5 MG tablet Take 1  tablet (5 mg total) by mouth daily.   aspirin EC 81 MG tablet Take 81 mg by mouth daily. Swallow whole.   atorvastatin (LIPITOR) 10 MG tablet Take 1 tablet (10 mg total) by mouth daily.   Calcium Carb-Cholecalciferol 600-10 MG-MCG TABS TAKE 2 TABLETS BY MOUTH EVERY DAY   docusate sodium (COLACE) 100 MG capsule Take 100 mg by mouth 2 (two) times daily.   EPINEPHrine 0.3 mg/0.3 mL IJ SOAJ injection Inject 0.3 mg into the muscle as needed for anaphylaxis.   nitrofurantoin (MACRODANTIN) 100 MG capsule TAKE 1 CAPSULE BY MOUTH TWICE A DAY (Patient taking differently: Take 100 mg by mouth at bedtime.)   No facility-administered encounter medications on file as of 08/19/2022.    Past Medical History:  Diagnosis Date   Anxiety    Basal cell carcinoma 06/18/2016   Posterior neck. Nodular pattern.   Hyperlipidemia    Hypertension     Past Surgical History:  Procedure Laterality Date   ABDOMINAL HYSTERECTOMY     APPENDECTOMY     BIV PACEMAKER GENERATOR CHANGEOUT N/A 06/13/2022   Procedure: BIV PACEMAKER GENERATOR CHANGEOUT;  Surgeon: Vickie Epley,  MD;  Location: [facility];  Service: Cardiovascular;  Laterality: N/A;   BLADDER SURGERY     CATARACT EXTRACTION     VEIN LIGATION AND STRIPPING     bilateral legs    Family History  Problem Relation Age of Onset   Pancreatic disease Mother    COPD Sister    Rheum arthritis Sister    Hypertension Daughter    Hyperlipidemia Daughter    Hyperlipidemia Son    Hypertension Son    Heart attack Son     Social History   Socioeconomic History   Marital status: Widowed    Spouse name: Not on file   Number of children: Not on file   Years of education: Not on file   Highest education level: Not on file  Occupational History   Not on file  Tobacco Use   Smoking status: Never   Smokeless tobacco: Never  Vaping Use   Vaping Use: Never used  Substance and Sexual Activity   Alcohol use: Never   Drug use: Never   Sexual  activity: Not Currently  Other Topics Concern   Not on file  Social History Narrative   Not on file   Social Determinants of Health   Financial Resource Strain: Low Risk  (07/12/2021)   Overall Financial Resource Strain (CARDIA)    Difficulty of Paying Living Expenses: Not very hard  Food Insecurity: No Food Insecurity (07/12/2021)   Hunger Vital Sign    Worried About Running Out of Food in the Last Year: Never true    Ran Out of Food in the Last Year: Never true  Transportation Needs: No Transportation Needs (07/12/2021)   PRAPARE - Hydrologist (Medical): No    Lack of Transportation (Non-Medical): No  Physical Activity: Insufficiently Active (07/12/2021)   Exercise Vital Sign    Days of Exercise per Week: 2 days    Minutes of Exercise per Session: 20 min  Stress: No Stress Concern Present (07/12/2021)   [place]    Feeling of Stress : Only a little  Social Connections: Moderately Integrated (07/12/2021)   Social Connection and Isolation Panel [NHANES]    Frequency of Communication with Friends and Family: More than three times a week    Frequency of Social Gatherings with Friends and Family: Three times a week    Attends Religious Services: 1 to 4 times per year    Active Member of Clubs or Organizations: Yes    Attends Archivist Meetings: 1 to 4 times per year    Marital Status: Widowed  Intimate Partner Violence: Not At Risk (07/12/2021)   Humiliation, Afraid, Rape, and Kick questionnaire    Fear of Current or Ex-Partner: No    Emotionally Abused: No    Physically Abused: No    Sexually Abused: No    Review of Systems  Constitutional:  Negative for chills and fever.  Eyes:  Negative for blurred vision.  Respiratory:  Negative for shortness of breath.   Cardiovascular:  Negative for chest pain.  Neurological:  Negative for dizziness and headaches.   Psychiatric/Behavioral:  The patient is nervous/anxious.        Objective    BP 134/74   Pulse 63   Temp 98.1 F (36.7 C)   Resp 16   Ht '5\' 2"'$  (1.575 m)   Wt 142 lb 8 oz (64.6 kg)   SpO2 96%  BMI 26.06 kg/m   Physical Exam Constitutional:      Appearance: Normal appearance.  HENT:     Head: Normocephalic and atraumatic.  Eyes:     Conjunctiva/sclera: Conjunctivae normal.  Cardiovascular:     Rate and Rhythm: Normal rate and regular rhythm.  Pulmonary:     Effort: Pulmonary effort is normal.     Breath sounds: Normal breath sounds.  Musculoskeletal:     Right lower leg: No edema.  Skin:    General: Skin is warm and dry.  Neurological:     General: No focal deficit present.     Mental Status: She is alert. Mental status is at baseline.  Psychiatric:        Mood and Affect: Mood normal.        Behavior: Behavior normal.         Assessment & Plan:   1. Anxiety: Discussed with the patient that I do not write for benzodiazepines and due to her age would be in her best interest to taper off this medication and be treated with something else to reduce side effects.  Patient is agreeable and understanding to this.  We will gradually taper down her Xanax -she is currently on 0.25 mg at night, she will take half a pill for 2 weeks and then a quarter of a pill for 2 weeks before completely discontinuing.  In the meantime she will be treated with BuSpar 10 mg at night to help treat her anxiety while she is tapering off the Xanax.  She will follow-up in 1 month for recheck where we can discuss starting SSRIs or hydroxyzine for anxiety.  - busPIRone (BUSPAR) 10 MG tablet; Take 1 tablet (10 mg total) by mouth daily.  Dispense: 30 tablet; Refill: 1  2. Essential hypertension: Blood pressure well controlled, continue amlodipine 5 mg daily.  Cardiology notes reviewed from 06/05/2022.  3. Mixed hyperlipidemia: Chronic and stable, reviewed lipid panel from 7/23 where her LDL was  good at 69.  Continue Lipitor 10 mg.  4. Flu vaccine need: Flu vaccine administered today.  - Flu Vaccine QUAD High Dose(Fluad)   Return in 4 weeks (on 09/16/2022) for AMW.   Teodora Medici, DO

## 2022-08-22 ENCOUNTER — Telehealth: Payer: Self-pay | Admitting: Cardiology

## 2022-08-22 NOTE — Telephone Encounter (Signed)
   Pre-operative Risk Assessment    Patient Name: Jessica Browning  DOB: 01/07/1934 MRN: [id number]     Request for Surgical Clearance    Procedure:   Routine cleaning  Date of Surgery:  Clearance 08/26/22                                 Surgeon:    Dr. Thurnell Lose Surgeon's Group or Practice Name:   Phone number:  [phone]  Fax number:  [phone]   Type of Clearance Requested:   - Medical    Type of Anesthesia:  None    Additional requests/questions:   Caller stated they will need to get clearance for this patient to have routine cleaning.  Signed, Heloise Beecham   08/22/2022, 9:17 AM

## 2022-08-22 NOTE — Telephone Encounter (Signed)
   Patient Name: Jessica Browning  DOB: [birth year] MRN: [id number]  Primary Cardiologist: None  Chart reviewed as part of pre-operative protocol coverage.   Simple dental extractions (i.e. 1-2 teeth) are considered low risk procedures per guidelines and generally do not require any specific cardiac clearance. It is also generally accepted that for simple extractions and dental cleanings, there is no need to interrupt blood thinner therapy.   SBE prophylaxis is not required for the patient from a cardiac standpoint.  I will route this recommendation to the requesting party via Epic fax function and remove from pre-op pool.  Please call with questions.  Lenna Sciara, NP 08/22/2022, 9:51 AM

## 2022-09-10 ENCOUNTER — Other Ambulatory Visit: Payer: Self-pay | Admitting: Internal Medicine

## 2022-09-10 DIAGNOSIS — F419 Anxiety disorder, unspecified: Secondary | ICD-10-CM

## 2022-09-10 NOTE — Telephone Encounter (Signed)
Requested medication (s) are due for refill today:   Yes  Requested medication (s) are on the active medication list:   Yes  Future visit scheduled:   Yes   Last ordered: 08/19/2022 #30, 1 refill  Returned because a 90 day supply and a DX Code are being requested   Requested Prescriptions  Pending Prescriptions Disp Refills   busPIRone (BUSPAR) 10 MG tablet [Pharmacy Med Name: BUSPIRONE HCL 10 MG TABLET] 90 tablet 1    Sig: TAKE 1 TABLET BY [place]     Psychiatry: Anxiolytics/Hypnotics - Non-controlled Passed - 09/10/2022 12:32 PM      Passed - Valid encounter within last 12 months    Recent Outpatient Visits           3 weeks ago [facility] Teodora Medici, DO       Future Appointments             In 1 week Teodora Medici, [facility], [place]   In 1 week Teodora Medici, [facility], [facility]

## 2022-09-17 ENCOUNTER — Ambulatory Visit (INDEPENDENT_AMBULATORY_CARE_PROVIDER_SITE_OTHER): Payer: Medicare Other

## 2022-09-17 VITALS — BP 118/72 | HR 72 | Temp 98.1°F | Resp 18 | Ht 62.0 in | Wt 142.6 lb

## 2022-09-17 DIAGNOSIS — Z0000 Encounter for general adult medical examination without abnormal findings: Secondary | ICD-10-CM

## 2022-09-17 NOTE — Progress Notes (Unsigned)
Electrophysiology Office Follow up Visit Note:    Date:  09/18/2022   ID:  Jessica Browning, DOB 1934-03-18, MRN [id number]  PCP:  Teodora Medici, DO  [place] Cardiologist:  None  [facility] Electrophysiologist:  Vickie Epley, MD    Interval History:    Jessica Browning is a 88 y.o. female who presents for a follow up visit. I performed a CRT generator replacement 06/13/2022. She has done well after generator replacement.  She is with family today in clinic.  She tells me she is done quite well.  No complaints today.     Past Medical History:  Diagnosis Date   Anxiety    Basal cell carcinoma 06/18/2016   Posterior neck. Nodular pattern.   Hyperlipidemia    Hypertension     Past Surgical History:  Procedure Laterality Date   ABDOMINAL HYSTERECTOMY     APPENDECTOMY     BIV PACEMAKER GENERATOR CHANGEOUT N/A 06/13/2022   Procedure: BIV PACEMAKER GENERATOR CHANGEOUT;  Surgeon: Vickie Epley, MD;  Location: [place] [facility];  Service: Cardiovascular;  Laterality: N/A;   BLADDER SURGERY     CATARACT EXTRACTION     VEIN LIGATION AND STRIPPING     bilateral legs    Current Medications: Current Meds  Medication Sig   amLODipine (NORVASC) 5 MG tablet Take 1 tablet (5 mg total) by mouth daily.   aspirin EC 81 MG tablet Take 81 mg by mouth daily. Swallow whole.   atorvastatin (LIPITOR) 10 MG tablet Take 1 tablet (10 mg total) by mouth daily.   busPIRone (BUSPAR) 10 MG tablet TAKE 1 TABLET BY MOUTH EVERY DAY   Calcium Carb-Cholecalciferol 600-10 MG-MCG TABS TAKE 2 TABLETS BY MOUTH EVERY DAY   docusate sodium (COLACE) 100 MG capsule Take 100 mg by mouth 2 (two) times daily.   EPINEPHrine 0.3 mg/0.3 mL IJ SOAJ injection Inject 0.3 mg into the muscle as needed for anaphylaxis.   nitrofurantoin (MACRODANTIN) 100 MG capsule TAKE 1 CAPSULE BY MOUTH TWICE A DAY (Patient taking differently: Take 100 mg by mouth at bedtime.)     Allergies:   Doxycycline and  Tetanus toxoids   Social History   Socioeconomic History   Marital status: Widowed    Spouse name: Not on file   Number of children: 6   Years of education: Not on file   Highest education level: Not on file  Occupational History   Occupation: Retired  Tobacco Use   Smoking status: Never   Smokeless tobacco: Never  Vaping Use   Vaping Use: Never used  Substance and Sexual Activity   Alcohol use: Never   Drug use: Never   Sexual activity: Not Currently  Other Topics Concern   Not on file  Social History Narrative   Not on file   Social Determinants of Health   Financial Resource Strain: Low Risk  (07/12/2021)   Overall Financial Resource Strain (CARDIA)    Difficulty of Paying Living Expenses: Not very hard  Food Insecurity: No Food Insecurity (09/17/2022)   Hunger Vital Sign    Worried About Running Out of Food in the Last Year: Never true    Ran Out of Food in the Last Year: Never true  Transportation Needs: No Transportation Needs (09/17/2022)   PRAPARE - Hydrologist (Medical): No    Lack of Transportation (Non-Medical): No  Physical Activity: Insufficiently Active (07/12/2021)   Exercise Vital Sign    Days of  Exercise per Week: 2 days    Minutes of Exercise per Session: 20 min  Stress: Stress Concern Present (09/17/2022)   [place]    Feeling of Stress : To some extent  Social Connections: Moderately Integrated (09/17/2022)   Social Connection and Isolation Panel [NHANES]    Frequency of Communication with Friends and Family: More than three times a week    Frequency of Social Gatherings with Friends and Family: Once a week    Attends Religious Services: More than 4 times per year    Active Member of [employer] or Organizations: Yes    Attends Archivist Meetings: More than 4 times per year    Marital Status: Widowed     Family History: The patient's family  history includes COPD in her sister; Heart attack in her son; Hyperlipidemia in her daughter and son; Hypertension in her daughter and son; Pancreatic disease in her mother; Rheum arthritis in her sister.  ROS:   Please see the history of present illness.    All other systems reviewed and are negative.  EKGs/Labs/Other Studies Reviewed:    The following studies were reviewed today:  09/18/2022 in clinic device interrogation personally reviewed Battery longevity 14 years Lead parameter stable Lead outputs changed to maximize battery longevity Atrial pacing 93.5% Ventricular pacing less than 0.1%   Recent Labs: 04/30/2022: ALT 9; TSH 2.74 06/05/2022: BUN 19; Creatinine, Ser 1.08; Hemoglobin 11.0; Platelets 196; Potassium 4.4; Sodium 132  Recent Lipid Panel    Component Value Date/Time   CHOL 146 04/30/2022 1639   TRIG 72 04/30/2022 1639   HDL 62 04/30/2022 1639   CHOLHDL 2.4 04/30/2022 1639   LDLCALC 69 04/30/2022 1639    Physical Exam:    VS:  BP 124/68   Pulse 88   Ht '5\' 2"'$  (1.575 m)   Wt 140 lb (63.5 kg)   SpO2 97%   BMI 25.61 kg/m     Wt Readings from Last 3 Encounters:  09/18/22 140 lb (63.5 kg)  09/17/22 142 lb 9.6 oz (64.7 kg)  08/19/22 142 lb 8 oz (64.6 kg)     GEN:  Well nourished, well developed in no acute distress HEENT: Normal NECK: No JVD; No carotid bruits LYMPHATICS: No lymphadenopathy CARDIAC: RRR, no murmurs, rubs, gallops.  Pacemaker site healing well. RESPIRATORY:  Clear to auscultation without rales, wheezing or rhonchi  ABDOMEN: Soft, non-tender, non-distended MUSCULOSKELETAL:  No edema; No deformity  SKIN: Warm and dry NEUROLOGIC:  Alert and oriented x 3 PSYCHIATRIC:  Normal affect        ASSESSMENT:    1. Paroxysmal atrial fibrillation (HCC)   2. Sick sinus syndrome (HCC)   3. Cardiac pacemaker in situ    PLAN:    In order of problems listed above:  #SSS #CRT in situ Device functioning appropriately. Continue remote  monitoring.   Follow-up 1 year with APP.    Medication Adjustments/Labs and Tests Ordered: Current medicines are reviewed at length with the patient today.  Concerns regarding medicines are outlined above.  No orders of the defined types were placed in this encounter.  No orders of the defined types were placed in this encounter.    Signed, Lars Mage, MD, [facility], [facility] 09/18/2022 11:13 AM    Electrophysiology [facility]

## 2022-09-17 NOTE — Patient Instructions (Signed)

## 2022-09-17 NOTE — Progress Notes (Signed)
Subjective:   Jessica Browning is a 88 y.o. female who presents for Medicare Annual (Subsequent) preventive examination.  Review of Systems    Per HPI unless specifically indicated below.  Cardiac Risk Factors include: advanced age (>52mn, >>41women);female gender          Objective:    Today's Vitals   09/17/22 1321  BP: 118/72  Pulse: 72  Resp: 18  Temp: 98.1 F (36.7 C)  TempSrc: Oral  SpO2: 98%  Weight: 142 lb 9.6 oz (64.7 kg)  Height: '5\' 2"'$  (1.575 m)  PainSc: 0-No pain   Body mass index is 26.08 kg/m.     06/13/2022   10:42 AM 07/12/2021   12:33 PM  Advanced Directives  Does Patient Have a Medical Advance Directive? Yes Yes  [email or web address]  Does patient want to make changes to medical advance directive? No - Patient declined     Current Medications (verified) Outpatient Encounter Medications as of 09/17/2022  Medication Sig   amLODipine (NORVASC) 5 MG tablet Take 1 tablet (5 mg total) by mouth daily.   aspirin EC 81 MG tablet Take 81 mg by mouth daily. Swallow whole.   atorvastatin (LIPITOR) 10 MG tablet Take 1 tablet (10 mg total) by mouth daily.   busPIRone (BUSPAR) 10 MG tablet TAKE 1 TABLET BY MOUTH EVERY DAY   Calcium Carb-Cholecalciferol 600-10 MG-MCG TABS TAKE 2 TABLETS BY MOUTH EVERY DAY   docusate sodium (COLACE) 100 MG capsule Take 100 mg by mouth 2 (two) times daily.   EPINEPHrine 0.3 mg/0.3 mL IJ SOAJ injection Inject 0.3 mg into the muscle as needed for anaphylaxis.   nitrofurantoin (MACRODANTIN) 100 MG capsule TAKE 1 CAPSULE BY MOUTH TWICE A DAY (Patient taking differently: Take 100 mg by mouth at bedtime.)   ALPRAZolam (XANAX) 0.25 MG tablet Take 1 tablet (0.25 mg total) by mouth 2 (two) times daily. (Patient not taking: Reported on 09/17/2022)   No facility-administered encounter medications on file as of 09/17/2022.    Allergies (verified) Doxycycline and Tetanus toxoids    History: Past Medical History:  Diagnosis Date   Anxiety    Basal cell carcinoma 06/18/2016   Posterior neck. Nodular pattern.   Hyperlipidemia    Hypertension    Past Surgical History:  Procedure Laterality Date   ABDOMINAL HYSTERECTOMY     APPENDECTOMY     BIV PACEMAKER GENERATOR CHANGEOUT N/A 06/13/2022   Procedure: BIV PACEMAKER GENERATOR CHANGEOUT;  Surgeon: LVickie Epley MD;  [facility];  Service: Cardiovascular;  Laterality: N/A;   BLADDER SURGERY     CATARACT EXTRACTION     VEIN LIGATION AND STRIPPING     bilateral legs   Family History  Problem Relation Age of Onset   Pancreatic disease Mother    COPD Sister    Rheum arthritis Sister    Hypertension Daughter    Hyperlipidemia Daughter    Hyperlipidemia Son    Hypertension Son    Heart attack Son    Social History   Socioeconomic History   Marital status: Widowed    Spouse name: Not on file   Number of children: 6   Years of education: Not on file   Highest education level: Not on file  Occupational History   Occupation: Retired  Tobacco Use   Smoking status: Never   Smokeless tobacco: Never  Vaping Use   Vaping Use: Never used  Substance  and Sexual Activity   Alcohol use: Never   Drug use: Never   Sexual activity: Not Currently  Other Topics Concern   Not on file  Social History Narrative   Not on file   Social Determinants of Health   Financial Resource Strain: Low Risk  (07/12/2021)   Overall Financial Resource Strain (CARDIA)    Difficulty of Paying Living Expenses: Not very hard  Food Insecurity: No Food Insecurity (09/17/2022)   Hunger Vital Sign    Worried About Running Out of Food in the Last Year: Never true    Ran Out of Food in the Last Year: Never true  Transportation Needs: No Transportation Needs (09/17/2022)   PRAPARE - Hydrologist (Medical): No    Lack of Transportation (Non-Medical): No  Physical Activity:  Insufficiently Active (07/12/2021)   Exercise Vital Sign    Days of Exercise per Week: 2 days    Minutes of Exercise per Session: 20 min  Stress: Stress Concern Present (09/17/2022)   [place]    Feeling of Stress : To some extent  Social Connections: Moderately Integrated (09/17/2022)   Social Connection and Isolation Panel [NHANES]    Frequency of Communication with Friends and Family: More than three times a week    Frequency of Social Gatherings with Friends and Family: Once a week    Attends Religious Services: More than 4 times per year    Active Member of [employer] or Organizations: Yes    Attends Archivist Meetings: More than 4 times per year    Marital Status: Widowed    Tobacco Counseling Counseling given: No   Clinical Intake:     Pain : No/denies pain Pain Score: 0-No pain     Nutritional Status: BMI 25 -29 Overweight Diabetes: No  How often do you need to have someone help you when you read instructions, pamphlets, or other written materials from your doctor or pharmacy?: 1 - Never  Diabetic?No  Interpreter Needed?: No  Information entered by :: Donnie Mesa, CMA   Activities of Daily Living    09/17/2022    1:29 PM 08/19/2022    1:58 PM  In your present state of health, do you have any difficulty performing the following activities:  Hearing? 1 0  Browning? 0 1  Comment Jessica Browning   Difficulty concentrating or making decisions? 1 1  Walking or climbing stairs? 0 0  Dressing or bathing? 0 0  Doing errands, shopping? 0 0    Patient Care Team: Teodora Medici, DO as PCP - General (Internal Medicine) Vickie Epley, MD as PCP - Electrophysiology (Cardiology)  Indicate any recent Medical Services you may have received from other than [facility] providers in the past year (date may be approximate). The pt was admitted to [facility] on 06/13/2022 for a  BIV Pacemaker.    Assessment:   This is a routine wellness examination for Jessica Browning.  Hearing/Browning screen Bilateral hearing aids.  Wear Glasses, Annual Eye Exam Jessica Browning. Browning has improved since cataract surgery this year. No longer wear glasses.    Dietary issues and exercise activities discussed: Current Exercise Habits: Home exercise routine, Type of exercise: walking, Time (Minutes): 15, Frequency (Times/Week): 3, Weekly Exercise (Minutes/Week): 45, Intensity: Mild   Goals Addressed             This Visit's Progress    Stay Active and Independent  Why is this important?   Regular activity or exercise is important to managing back pain.  Activity helps to keep your muscles strong.  You will sleep better and feel more relaxed.  You will have more energy and feel less stressed.  If you are not active now, start slowly. Little changes make a big difference.  Rest, but not too much.  Stay as active as you can and listen to your body's signals.            Depression Screen    09/17/2022    1:41 PM 08/19/2022    1:59 PM 07/12/2021   12:35 PM 07/12/2021   12:29 PM 04/30/2021    8:54 AM 06/29/2020    9:32 AM  PHQ 2/9 Scores  PHQ - 2 Score 2 0 0 0 0 0  PHQ- 9 Score  4        Fall Risk    09/17/2022    1:41 PM 08/19/2022    1:58 PM 07/12/2021   12:34 PM 07/02/2021    9:10 AM 04/30/2021    8:53 AM  [place] in the past year? '1 1 1  1  '$ Number falls in past yr: 0 0 0 0 0  Injury with Fall? '1 1 1 '$ 0 0  Risk for fall due to : Impaired balance/gait  History of fall(s)  History of fall(s)  Follow up Falls evaluation completed  Falls evaluation completed Falls evaluation completed     [place]:  Any stairs in or around the home? Yes  If so, are there any without handrails? No  Home free of loose throw rugs in walkways, pet beds, electrical cords, etc? Yes  Adequate lighting in your home to reduce risk of falls? Yes    ASSISTIVE DEVICES UTILIZED TO PREVENT FALLS:  Life alert? No  Use of a cane, walker or w/c? No  Grab bars in the bathroom? Yes  Shower chair or bench in shower? Yes  Elevated toilet seat or a handicapped toilet? Yes   TIMED UP AND GO:  Was the test performed? Yes .  Length of time to ambulate 10 feet: 10 sec.   Gait slow and steady without use of assistive device  Cognitive Function:    07/12/2021   12:37 PM  MMSE - Mini Mental State Exam  Orientation to time 5  Orientation to Place 5  Registration 3  Attention/ Calculation 5  Recall 3  Language- name 2 objects 2  Language- repeat 1  Language- follow 3 step command 3  Language- read & follow direction 1  Write a sentence 0  Copy design 0  Total score 28        09/17/2022    1:43 PM 07/12/2021   12:37 PM  6CIT Screen  What Year? 0 points 0 points  What month? 0 points 0 points  What time? 0 points 0 points  Count back from 20 0 points 0 points  Months in reverse 0 points 0 points  Repeat phrase 10 points 0 points  Total Score 10 points 0 points    Immunizations Immunization History  Administered Date(s) Administered   Fluad Quad(high Dose 65+) 06/29/2020, 08/19/2022   Influenza, High Dose Seasonal PF 08/01/2015, 07/23/2016, 07/21/2018, 08/10/2021   Influenza-Unspecified 06/22/2015, 06/21/2018   Moderna Sars-Covid-2 Vaccination 11/03/2019, 12/01/2019, 07/18/2020, 08/21/2020   Pneumococcal Polysaccharide-23 07/21/2018, 07/24/2019   Tdap 12/28/2009   Zoster Recombinat (Shingrix) 12/01/2012  TDAP status: Due, Education has been provided regarding the importance of this vaccine. Advised may receive this vaccine at local pharmacy or Health Dept. Aware to provide a copy of the vaccination record if obtained from local pharmacy or Health Dept. Verbalized acceptance and understanding.  Flu Vaccine status: Up to date  Pneumococcal vaccine status: Up to date  Covid-19 vaccine status: Information provided on  how to obtain vaccines.   Qualifies for Shingles Vaccine? Yes   Zostavax completed Yes   Shingrix Completed?: No.    Education has been provided regarding the importance of this vaccine. Patient has been advised to call insurance company to determine out of pocket expense if they have not yet received this vaccine. Advised may also receive vaccine at local pharmacy or Health Dept. Verbalized acceptance and understanding.  Screening Tests Health Maintenance  Topic Date Due   COVID-19 Vaccine (5 - 2023-24 season) 06/21/2022   Zoster Vaccines- Shingrix (2 of 2) 11/19/2022 (Originally 01/26/2013)   Pneumonia Vaccine 81+ Years old (2 - PCV) 08/07/2023 (Originally 07/23/2020)   DEXA SCAN  08/20/2023 (Originally 07/15/2001)   Medicare Annual Wellness (AWV)  09/18/2023   INFLUENZA VACCINE  Completed   HPV VACCINES  Aged Out    Health Maintenance  Health Maintenance Due  Topic Date Due   COVID-19 Vaccine (5 - 2023-24 season) 06/21/2022    Colorectal cancer screening: No longer required.   Mammogram status: No longer required due to age.  DEXA Scan: No longer required, per provider   Lung Cancer Screening: (Low Dose CT Chest recommended if Age 5-80 years, 30 pack-year currently smoking OR have quit w/in 15years.) does not qualify.     Additional Screening:  Hepatitis C Screening: does not qualify  Browning Screening: Recommended annual ophthalmology exams for early detection of glaucoma and other disorders of the eye. Is the patient up to date with their annual eye exam?  Yes  Who is the provider or what is the name of the office in which the patient attends annual eye exams? [facility] If pt is not established with a provider, would they like to be referred to a provider to establish care? No .   Dental Screening: Recommended annual dental exams for proper oral hygiene  Community Resource Referral / Chronic Care Management: CRR required this visit?  No   CCM required this  visit?  No      Plan:     I have personally reviewed and noted the following in the patient's chart:   Medical and social history Use of alcohol, tobacco or illicit drugs  Current medications and supplements including opioid prescriptions. Patient is not currently taking opioid prescriptions. Functional ability and status Nutritional status Physical activity Advanced directives List of other physicians Hospitalizations, surgeries, and ER visits in previous 12 months Vitals Screenings to include cognitive, depression, and falls Referrals and appointments  In addition, I have reviewed and discussed with patient certain preventive protocols, quality metrics, and best practice recommendations. A written personalized care plan for preventive services as well as general preventive health recommendations were provided to patient.   Jessica Browning , Thank you for taking time to come for your Medicare Wellness Visit. I appreciate your ongoing commitment to your health goals. Please review the following plan we discussed and let me know if I can assist you in the future.   These are the goals we discussed:  Goals      Stay Active and Independent     Why  is this important?   Regular activity or exercise is important to managing back pain.  Activity helps to keep your muscles strong.  You will sleep better and feel more relaxed.  You will have more energy and feel less stressed.  If you are not active now, start slowly. Little changes make a big difference.  Rest, but not too much.  Stay as active as you can and listen to your body's signals.             This is a list of the screening recommended for you and due dates:  Health Maintenance  Topic Date Due   COVID-19 Vaccine (5 - 2023-24 season) 06/21/2022   Zoster (Shingles) Vaccine (2 of 2) 11/19/2022*   Pneumonia Vaccine (2 - PCV) 08/07/2023*   DEXA scan (bone density measurement)  08/20/2023*   Medicare Annual Wellness Visit   09/18/2023   Flu Shot  Completed   HPV Vaccine  Aged Out  *Topic was postponed. The date shown is not the original due date.      Wilson Singer, [place]   09/17/2022   Nurse Notes: Approximately 30 minute Face -To-Face Medicare Wellness Visit

## 2022-09-18 ENCOUNTER — Ambulatory Visit: Payer: Medicare Other | Attending: Cardiology | Admitting: Cardiology

## 2022-09-18 ENCOUNTER — Encounter: Payer: Self-pay | Admitting: Cardiology

## 2022-09-18 VITALS — BP 124/68 | HR 88 | Ht 62.0 in | Wt 140.0 lb

## 2022-09-18 DIAGNOSIS — I480 Paroxysmal atrial fibrillation: Secondary | ICD-10-CM | POA: Diagnosis not present

## 2022-09-18 DIAGNOSIS — Z950 Presence of cardiac pacemaker: Secondary | ICD-10-CM | POA: Diagnosis not present

## 2022-09-18 DIAGNOSIS — I495 Sick sinus syndrome: Secondary | ICD-10-CM

## 2022-09-18 LAB — CUP PACEART INCLINIC DEVICE CHECK
Brady Statistic RA Percent Paced: 93.5 %
Brady Statistic RV Percent Paced: 0.1 % — CL
Date Time Interrogation Session: 20231129113056
Implantable Lead Connection Status: 753985
Implantable Lead Connection Status: 753985
Implantable Lead Implant Date: 20121031
Implantable Lead Implant Date: 20121031
Implantable Lead Location: 753859
Implantable Lead Location: 753860
Implantable Lead Model: 5092
Implantable Lead Model: 5592
Implantable Pulse Generator Implant Date: 20230824
Lead Channel Impedance Value: 380 Ohm
Lead Channel Impedance Value: 475 Ohm
Lead Channel Pacing Threshold Amplitude: 0.5 V
Lead Channel Pacing Threshold Amplitude: 0.5 V
Lead Channel Pacing Threshold Amplitude: 1 V
Lead Channel Pacing Threshold Amplitude: 1 V
Lead Channel Pacing Threshold Pulse Width: 0.4 ms
Lead Channel Pacing Threshold Pulse Width: 0.4 ms
Lead Channel Pacing Threshold Pulse Width: 0.4 ms
Lead Channel Pacing Threshold Pulse Width: 0.5 ms
Lead Channel Sensing Intrinsic Amplitude: 3.9 mV
Lead Channel Sensing Intrinsic Amplitude: 7.5 mV
Lead Channel Setting Pacing Amplitude: 1.5 V
Lead Channel Setting Pacing Amplitude: 3.5 V
Lead Channel Setting Pacing Pulse Width: 0.4 ms
Lead Channel Setting Sensing Sensitivity: 0.9 mV
Zone Setting Status: 755011
Zone Setting Status: 755011

## 2022-09-18 LAB — PACEMAKER DEVICE OBSERVATION

## 2022-09-18 NOTE — Patient Instructions (Signed)
Medication Instructions:  Your physician recommends that you continue on your current medications as directed. Please refer to the Current Medication list given to you today.  *If you need a refill on your cardiac medications before your next appointment, please call your pharmacy*  Follow-Up: At [facility], you and your health needs are our priority.  As part of our continuing mission to provide you with exceptional heart care, we have created designated Provider Care Teams.  These Care Teams include your primary Cardiologist (physician) and Advanced Practice Providers (APPs -  Physician Assistants and Nurse Practitioners) who all work together to provide you with the care you need, when you need it.  Your next appointment:   1 year(s)  The format for your next appointment:   In Person  Provider:   You will see one of the following Advanced Practice Providers on your designated Care Team:   Tommye Standard, [place] Legrand Como "Jonni Sanger" Chalmers Cater, [place]   Important Information About Sugar

## 2022-09-20 ENCOUNTER — Encounter: Payer: Self-pay | Admitting: Internal Medicine

## 2022-09-20 ENCOUNTER — Ambulatory Visit (INDEPENDENT_AMBULATORY_CARE_PROVIDER_SITE_OTHER): Payer: Medicare Other | Admitting: Internal Medicine

## 2022-09-20 VITALS — BP 136/78 | HR 81 | Temp 98.2°F | Resp 16 | Ht 62.0 in | Wt 140.5 lb

## 2022-09-20 DIAGNOSIS — N390 Urinary tract infection, site not specified: Secondary | ICD-10-CM | POA: Diagnosis not present

## 2022-09-20 DIAGNOSIS — F419 Anxiety disorder, unspecified: Secondary | ICD-10-CM | POA: Diagnosis not present

## 2022-09-20 DIAGNOSIS — Z23 Encounter for immunization: Secondary | ICD-10-CM

## 2022-09-20 MED ORDER — CALCIUM CARB-CHOLECALCIFEROL 600-10 MG-MCG PO TABS
2.0000 | ORAL_TABLET | Freq: Every day | ORAL | 6 refills | Status: AC
Start: 2022-09-20 — End: ?

## 2022-09-20 NOTE — Progress Notes (Signed)
Established Patient Office Visit  Subjective    Patient ID: Jessica Browning, female    DOB: Mar 29, 1934  Age: 88 y.o. MRN: [id number]  CC:  Chief Complaint  Patient presents with   Follow-up    HPI Jessica Browning presents to follow up. She is here with her daughter.   Hypertension/Sinus Sick Syndrome: -Medications: Amlodipine 5 mg -Patient is compliant with above medications and reports no side effects. -Checking BP at home (average): doesn't check -Denies any SOB, CP, vision changes, LE edema or symptoms of hypotension -Following with Cardiology, last note reviewed from 06/05/22. Pacemaker placed in August 2023  HLD: -Medications: Lipitor 10 mg, aspirin 81 mg -Patient is compliant with above medications and reports no side effects.  -Last lipid panel: Lipid Panel     Component Value Date/Time   CHOL 146 04/30/2022 1639   TRIG 72 04/30/2022 1639   HDL 62 04/30/2022 1639   CHOLHDL 2.4 04/30/2022 1639   LDLCALC 69 04/30/2022 1639   Anxiety: -Has been on Xanax 0.25 mg before bed every day for the last 20+ years, she has not been on anything else for anxiety. -At our last office visit, she was given a taper regimen so that at follow up she would be off the medication. She was given Buspar 10 mg to help with this transition. Today she states that she is completely off the Xanax and is feeling much better, states she is sleeping without issues now. Is doing well on the Buspar, feels like her anxiety is much better controlled.      09/20/2022   12:59 PM 09/17/2022    1:41 PM 08/19/2022    1:59 PM 07/12/2021   12:35 PM 07/12/2021   12:29 PM  Depression screen PHQ 2/9  Decreased Interest 0 1 0 0 0  Down, Depressed, Hopeless 0 1 0 0 0  PHQ - 2 Score 0 2 0 0 0  Altered sleeping 0  0    Tired, decreased energy 0  1    Change in appetite 0  0    Feeling bad or failure about yourself  0  0    Trouble concentrating 0  3    Moving slowly or fidgety/restless 0  0    Suicidal  thoughts 0  0    PHQ-9 Score 0  4    Difficult doing work/chores Not difficult at all  Not difficult at all      Also has a history of recurrent UTI's, has been on Nitrofurantoin 100 mg at bedtime for years for suppression therapy. Notes reviewed from Urogynecology  reviewed from 05/01/21. No symptoms since being on the suppression therapy.   Health maintenance: -Blood work up-to-date -Prevnar 20 due -Tdap due   Outpatient Encounter Medications as of 09/20/2022  Medication Sig   amLODipine (NORVASC) 5 MG tablet Take 1 tablet (5 mg total) by mouth daily.   aspirin EC 81 MG tablet Take 81 mg by mouth daily. Swallow whole.   atorvastatin (LIPITOR) 10 MG tablet Take 1 tablet (10 mg total) by mouth daily.   busPIRone (BUSPAR) 10 MG tablet TAKE 1 TABLET BY MOUTH EVERY DAY   Calcium Carb-Cholecalciferol 600-10 MG-MCG TABS TAKE 2 TABLETS BY MOUTH EVERY DAY   docusate sodium (COLACE) 100 MG capsule Take 100 mg by mouth 2 (two) times daily.   EPINEPHrine 0.3 mg/0.3 mL IJ SOAJ injection Inject 0.3 mg into the muscle as needed for anaphylaxis.   nitrofurantoin (MACRODANTIN) 100 MG capsule  TAKE 1 CAPSULE BY MOUTH TWICE A DAY (Patient taking differently: Take 100 mg by mouth at bedtime.)   No facility-administered encounter medications on file as of 09/20/2022.    Past Medical History:  Diagnosis Date   Anxiety    Basal cell carcinoma 06/18/2016   Posterior neck. Nodular pattern.   Hyperlipidemia    Hypertension     Past Surgical History:  Procedure Laterality Date   ABDOMINAL HYSTERECTOMY     APPENDECTOMY     BIV PACEMAKER GENERATOR CHANGEOUT N/A 06/13/2022   Procedure: BIV PACEMAKER GENERATOR CHANGEOUT;  Surgeon: Vickie Epley, MD;  Location: [facility];  Service: Cardiovascular;  Laterality: N/A;   BLADDER SURGERY     CATARACT EXTRACTION     VEIN LIGATION AND STRIPPING     bilateral legs    Family History  Problem Relation Age of Onset   Pancreatic disease Mother     COPD Sister    Rheum arthritis Sister    Hypertension Daughter    Hyperlipidemia Daughter    Hyperlipidemia Son    Hypertension Son    Heart attack Son     Social History   Socioeconomic History   Marital status: Widowed    Spouse name: Not on file   Number of children: 6   Years of education: Not on file   Highest education level: Not on file  Occupational History   Occupation: Retired  Tobacco Use   Smoking status: Never   Smokeless tobacco: Never  Vaping Use   Vaping Use: Never used  Substance and Sexual Activity   Alcohol use: Never   Drug use: Never   Sexual activity: Not Currently  Other Topics Concern   Not on file  Social History Narrative   Not on file   Social Determinants of Health   Financial Resource Strain: Low Risk  (07/12/2021)   Overall Financial Resource Strain (CARDIA)    Difficulty of Paying Living Expenses: Not very hard  Food Insecurity: No Food Insecurity (09/17/2022)   Hunger Vital Sign    Worried About Running Out of Food in the Last Year: Never true    Ran Out of Food in the Last Year: Never true  Transportation Needs: No Transportation Needs (09/17/2022)   PRAPARE - Hydrologist (Medical): No    Lack of Transportation (Non-Medical): No  Physical Activity: Insufficiently Active (07/12/2021)   Exercise Vital Sign    Days of Exercise per Week: 2 days    Minutes of Exercise per Session: 20 min  Stress: Stress Concern Present (09/17/2022)   [place]    Feeling of Stress : To some extent  Social Connections: Moderately Integrated (09/17/2022)   Social Connection and Isolation Panel [NHANES]    Frequency of Communication with Friends and Family: More than three times a week    Frequency of Social Gatherings with Friends and Family: Once a week    Attends Religious Services: More than 4 times per year    Active Member of [employer] or Organizations: Yes     Attends Archivist Meetings: More than 4 times per year    Marital Status: Widowed  Intimate Partner Violence: Not At Risk (09/17/2022)   Humiliation, Afraid, Rape, and Kick questionnaire    Fear of Current or Ex-Partner: No    Emotionally Abused: No    Physically Abused: No    Sexually Abused: No    Review  of Systems  Constitutional:  Negative for chills and fever.  Eyes:  Negative for blurred vision.  Respiratory:  Negative for shortness of breath.   Cardiovascular:  Negative for chest pain.  Neurological:  Negative for dizziness and headaches.  Psychiatric/Behavioral:  The patient is not nervous/anxious and does not have insomnia.        Objective    BP 136/78   Pulse 81   Temp 98.2 F (36.8 C)   Resp 16   Ht '5\' 2"'$  (1.575 m)   Wt 140 lb 8 oz (63.7 kg)   SpO2 95%   BMI 25.70 kg/m   Physical Exam Constitutional:      Appearance: Normal appearance.  HENT:     Head: Normocephalic and atraumatic.  Eyes:     Conjunctiva/sclera: Conjunctivae normal.  Cardiovascular:     Rate and Rhythm: Normal rate and regular rhythm.  Pulmonary:     Effort: Pulmonary effort is normal.     Breath sounds: Normal breath sounds.  Musculoskeletal:     Right lower leg: No edema.  Skin:    General: Skin is warm and dry.  Neurological:     General: No focal deficit present.     Mental Status: She is alert. Mental status is at baseline.  Psychiatric:        Mood and Affect: Mood normal.        Behavior: Behavior normal.       Assessment & Plan:   1. Anxiety: Patient did very well with Xanax taper, anxiety better controlled with Buspar 10 mg daily.   2. Recurrent UTI: Reviewed Urogyn notes from 2022. Patient has been on Macrobid suppression therapy since this time without symptoms or issues, will continue.  - nitrofurantoin (MACRODANTIN) 100 MG capsule; Take 1 capsule (100 mg total) by mouth at bedtime.  3. Need for prophylactic vaccination with Streptococcus  pneumoniae (Pneumococcus) and Influenza vaccines: Prevnar 20 administered today.   - Pneumococcal conjugate vaccine 20-valent (Prevnar 20)   Return in about 3 months (around 12/20/2022).   Teodora Medici, DO

## 2022-10-24 ENCOUNTER — Ambulatory Visit (INDEPENDENT_AMBULATORY_CARE_PROVIDER_SITE_OTHER): Payer: Medicare Other

## 2022-10-24 DIAGNOSIS — I495 Sick sinus syndrome: Secondary | ICD-10-CM | POA: Diagnosis not present

## 2022-10-24 LAB — CUP PACEART REMOTE DEVICE CHECK
Battery Remaining Longevity: 161 mo
Battery Voltage: 3.2 V
Brady Statistic AP VP Percent: 0.02 %
Brady Statistic AP VS Percent: 95.02 %
Brady Statistic AS VP Percent: 0 %
Brady Statistic AS VS Percent: 4.96 %
Brady Statistic RA Percent Paced: 94.99 %
Brady Statistic RV Percent Paced: 0.02 %
Date Time Interrogation Session: 20240103202957
Implantable Lead Connection Status: 753985
Implantable Lead Connection Status: 753985
Implantable Lead Implant Date: 20121031
Implantable Lead Implant Date: 20121031
Implantable Lead Location: 753859
Implantable Lead Location: 753860
Implantable Lead Model: 5092
Implantable Lead Model: 5592
Implantable Pulse Generator Implant Date: 20230824
Lead Channel Impedance Value: 342 Ohm
Lead Channel Impedance Value: 380 Ohm
Lead Channel Impedance Value: 399 Ohm
Lead Channel Impedance Value: 494 Ohm
Lead Channel Pacing Threshold Amplitude: 0.625 V
Lead Channel Pacing Threshold Pulse Width: 0.4 ms
Lead Channel Sensing Intrinsic Amplitude: 3 mV
Lead Channel Sensing Intrinsic Amplitude: 3 mV
Lead Channel Sensing Intrinsic Amplitude: 6.125 mV
Lead Channel Sensing Intrinsic Amplitude: 6.125 mV
Lead Channel Setting Pacing Amplitude: 1.5 V
Lead Channel Setting Pacing Amplitude: 2.5 V
Lead Channel Setting Pacing Pulse Width: 0.4 ms
Lead Channel Setting Sensing Sensitivity: 0.9 mV
Zone Setting Status: 755011
Zone Setting Status: 755011

## 2022-11-14 NOTE — Progress Notes (Signed)
Remote pacemaker transmission.   

## 2022-11-28 DIAGNOSIS — Z961 Presence of intraocular lens: Secondary | ICD-10-CM | POA: Diagnosis not present

## 2022-12-19 NOTE — Progress Notes (Signed)
Established Patient Office Visit  Subjective    Patient ID: Jessica Browning, female    DOB: 08/19/1934  Age: 89 y.o. MRN: [id number]  CC:  Chief Complaint  Patient presents with   Follow-up    HPI Jessica Browning presents to follow up. She is here with her daughter. Patient has been concerned about forgetfulness lately.      12/20/2022    1:34 PM 07/12/2021   12:37 PM  MMSE - Mini Mental State Exam  Orientation to time 5 5  Orientation to Place 5 5  Registration 3 3  Attention/ Calculation 3 5  Recall 3 3  Language- name 2 objects 2 2  Language- repeat 1 1  Language- follow 3 step command 3 3  Language- read & follow direction 1 1  Write a sentence 1 0  Copy design 1 0  Total score 28 28   She would also like to discuss acid reflux symptoms. Patient states she has been having burning like sensation and increased burping and gas, mainly at night. She denies abdominal pain, nausea, vomiting or decrease in appetite.   Hypertension/Sinus Sick Syndrome: -Medications: Amlodipine 5 mg -Patient is compliant with above medications and reports no side effects. -Checking BP at home (average): doesn't check -Denies any SOB, CP, vision changes, LE edema or symptoms of hypotension -Following with Cardiology, last note reviewed from 06/05/22. Pacemaker placed in August 2023  HLD: -Medications: Lipitor 10 mg, aspirin 81 mg -Patient is compliant with above medications and reports no side effects.  -Last lipid panel: Lipid Panel     Component Value Date/Time   CHOL 146 04/30/2022 1639   TRIG 72 04/30/2022 1639   HDL 62 04/30/2022 1639   CHOLHDL 2.4 04/30/2022 1639   LDLCALC 69 04/30/2022 1639   Anxiety: -Currently on Buspar 10 mg and doing well -Had been on Xanax 0.25 mg before bed every day for the last 20+ years but she tapered off this successfully      12/20/2022   12:58 PM 09/20/2022   12:59 PM 09/17/2022    1:41 PM 08/19/2022    1:59 PM 07/12/2021   12:35 PM   Depression screen PHQ 2/9  Decreased Interest 0 0 1 0 0  Down, Depressed, Hopeless 1 0 1 0 0  PHQ - 2 Score 1 0 2 0 0  Altered sleeping 0 0  0   Tired, decreased energy 0 0  1   Change in appetite 0 0  0   Feeling bad or failure about yourself  0 0  0   Trouble concentrating 0 0  3   Moving slowly or fidgety/restless 0 0  0   Suicidal thoughts 0 0  0   PHQ-9 Score 1 0  4   Difficult doing work/chores  Not difficult at all  Not difficult at all    Also has a history of recurrent UTI's, has been on Nitrofurantoin 100 mg at bedtime for years for suppression therapy. Notes reviewed from Urogynecology  reviewed from 05/01/21. No symptoms since being on the suppression therapy.   Health maintenance: -Blood work up-to-date -Tdap due   Outpatient Encounter Medications as of 12/20/2022  Medication Sig   amLODipine (NORVASC) 5 MG tablet Take 1 tablet (5 mg total) by mouth daily.   aspirin EC 81 MG tablet Take 81 mg by mouth daily. Swallow whole.   atorvastatin (LIPITOR) 10 MG tablet Take 1 tablet (10 mg total) by mouth daily.  busPIRone (BUSPAR) 10 MG tablet TAKE 1 TABLET BY MOUTH EVERY DAY   Calcium Carb-Cholecalciferol 600-10 MG-MCG TABS Take 2 tablets by mouth daily.   docusate sodium (COLACE) 100 MG capsule Take 100 mg by mouth 2 (two) times daily.   EPINEPHrine 0.3 mg/0.3 mL IJ SOAJ injection Inject 0.3 mg into the muscle as needed for anaphylaxis.   nitrofurantoin (MACRODANTIN) 100 MG capsule Take 1 capsule (100 mg total) by mouth at bedtime.   No facility-administered encounter medications on file as of 12/20/2022.    Past Medical History:  Diagnosis Date   Anxiety    Basal cell carcinoma 06/18/2016   Posterior neck. Nodular pattern.   Hyperlipidemia    Hypertension     Past Surgical History:  Procedure Laterality Date   ABDOMINAL HYSTERECTOMY     APPENDECTOMY     BIV PACEMAKER GENERATOR CHANGEOUT N/A 06/13/2022   Procedure: BIV PACEMAKER GENERATOR CHANGEOUT;  Surgeon:  Vickie Epley, MD;  Location: [facility];  Service: Cardiovascular;  Laterality: N/A;   BLADDER SURGERY     CATARACT EXTRACTION     VEIN LIGATION AND STRIPPING     bilateral legs    Family History  Problem Relation Age of Onset   Pancreatic disease Mother    COPD Sister    Rheum arthritis Sister    Hypertension Daughter    Hyperlipidemia Daughter    Hyperlipidemia Son    Hypertension Son    Heart attack Son     Social History   Socioeconomic History   Marital status: Widowed    Spouse name: Not on file   Number of children: 6   Years of education: Not on file   Highest education level: Not on file  Occupational History   Occupation: Retired  Tobacco Use   Smoking status: Never   Smokeless tobacco: Never  Vaping Use   Vaping Use: Never used  Substance and Sexual Activity   Alcohol use: Never   Drug use: Never   Sexual activity: Not Currently  Other Topics Concern   Not on file  Social History Narrative   Not on file   Social Determinants of Health   Financial Resource Strain: Low Risk  (07/12/2021)   Overall Financial Resource Strain (CARDIA)    Difficulty of Paying Living Expenses: Not very hard  Food Insecurity: No Food Insecurity (09/17/2022)   Hunger Vital Sign    Worried About Running Out of Food in the Last Year: Never true    Ran Out of Food in the Last Year: Never true  Transportation Needs: No Transportation Needs (09/17/2022)   PRAPARE - Hydrologist (Medical): No    Lack of Transportation (Non-Medical): No  Physical Activity: Insufficiently Active (07/12/2021)   Exercise Vital Sign    Days of Exercise per Week: 2 days    Minutes of Exercise per Session: 20 min  Stress: Stress Concern Present (09/17/2022)   [place]    Feeling of Stress : To some extent  Social Connections: Moderately Integrated (09/17/2022)   Social Connection and  Isolation Panel [NHANES]    Frequency of Communication with Friends and Family: More than three times a week    Frequency of Social Gatherings with Friends and Family: Once a week    Attends Religious Services: More than 4 times per year    Active Member of [employer] or Organizations: Yes    Attends Archivist  Meetings: More than 4 times per year    Marital Status: Widowed  Intimate Partner Violence: Not At Risk (09/17/2022)   Humiliation, Afraid, Rape, and Kick questionnaire    Fear of Current or Ex-Partner: No    Emotionally Abused: No    Physically Abused: No    Sexually Abused: No    Review of Systems  Constitutional:  Negative for chills and fever.  Eyes:  Negative for blurred vision.  Respiratory:  Negative for shortness of breath.   Cardiovascular:  Negative for chest pain.  Gastrointestinal:  Positive for heartburn. Negative for abdominal pain, nausea and vomiting.  Neurological:  Negative for dizziness and headaches.  Psychiatric/Behavioral:  The patient is not nervous/anxious and does not have insomnia.        Objective    BP (!) 140/84   Pulse 87   Temp 98.1 F (36.7 C)   Resp 16   Ht '5\' 2"'$  (1.575 m)   Wt 140 lb 9.6 oz (63.8 kg)   SpO2 96%   BMI 25.72 kg/m   Physical Exam Constitutional:      Appearance: Normal appearance.  HENT:     Head: Normocephalic and atraumatic.  Eyes:     Conjunctiva/sclera: Conjunctivae normal.  Cardiovascular:     Rate and Rhythm: Normal rate and regular rhythm.  Pulmonary:     Effort: Pulmonary effort is normal.     Breath sounds: Normal breath sounds.  Musculoskeletal:     Right lower leg: No edema.     Left lower leg: No edema.  Skin:    General: Skin is warm and dry.  Neurological:     General: No focal deficit present.     Mental Status: She is alert. Mental status is at baseline.  Psychiatric:        Mood and Affect: Mood normal.        Behavior: Behavior normal.       Assessment & Plan:   1.  Essential hypertension: Chronic, slightly elevated today. Continue Amlodipine 5 mg.   2. Mixed hyperlipidemia: Stable, continue Lipitor 10 mg.  3. Gastroesophageal reflux disease, unspecified whether esophagitis present: Start Pepcid 20 mg daily. Discussed avoiding triggering foods and avoiding laying down 3-4 hours after eating.  - famotidine (PEPCID) 20 MG tablet; Take 1 tablet (20 mg total) by mouth daily.  Dispense: 90 tablet; Refill: 1  4. Anxiety: Stable, doing well on Buspar 10 mg.  5. Forgetfulness: Mini mental exam today consistent with age related forgetfulness, continue to monitor.    Return in about 6 months (around 06/22/2023).   Teodora Medici, DO

## 2022-12-20 ENCOUNTER — Ambulatory Visit (INDEPENDENT_AMBULATORY_CARE_PROVIDER_SITE_OTHER): Payer: Medicare Other | Admitting: Internal Medicine

## 2022-12-20 ENCOUNTER — Encounter: Payer: Self-pay | Admitting: Internal Medicine

## 2022-12-20 VITALS — BP 140/84 | HR 87 | Temp 98.1°F | Resp 16 | Ht 62.0 in | Wt 140.6 lb

## 2022-12-20 DIAGNOSIS — F419 Anxiety disorder, unspecified: Secondary | ICD-10-CM | POA: Diagnosis not present

## 2022-12-20 DIAGNOSIS — I10 Essential (primary) hypertension: Secondary | ICD-10-CM

## 2022-12-20 DIAGNOSIS — K219 Gastro-esophageal reflux disease without esophagitis: Secondary | ICD-10-CM

## 2022-12-20 DIAGNOSIS — E782 Mixed hyperlipidemia: Secondary | ICD-10-CM | POA: Diagnosis not present

## 2022-12-20 DIAGNOSIS — R6889 Other general symptoms and signs: Secondary | ICD-10-CM

## 2022-12-20 MED ORDER — FAMOTIDINE 20 MG PO TABS: 20.0000 mg | ORAL_TABLET | Freq: Every day | ORAL | 1 refills | Status: DC

## 2022-12-20 NOTE — Patient Instructions (Addendum)
It was great seeing you today!  Plan discussed at today's visit: -Acid reflex medication sent to pharmacy   Follow up in: 6 months or sooner as needed  Take care and let us know if you have any questions or concerns prior to your next visit.  Dr. Rosana Berger

## 2022-12-30 ENCOUNTER — Encounter: Payer: Self-pay | Admitting: Cardiology

## 2023-01-23 ENCOUNTER — Ambulatory Visit (INDEPENDENT_AMBULATORY_CARE_PROVIDER_SITE_OTHER): Payer: Medicare Other

## 2023-01-23 DIAGNOSIS — I495 Sick sinus syndrome: Secondary | ICD-10-CM

## 2023-01-23 LAB — CUP PACEART REMOTE DEVICE CHECK
Battery Remaining Longevity: 159 mo
Battery Voltage: 3.17 V
Brady Statistic AP VP Percent: 0 %
Brady Statistic AP VS Percent: 95.55 %
Brady Statistic AS VP Percent: 0 %
Brady Statistic AS VS Percent: 4.44 %
Brady Statistic RA Percent Paced: 95.54 %
Brady Statistic RV Percent Paced: 0 %
Date Time Interrogation Session: 20240404071636
Implantable Lead Connection Status: 753985
Implantable Lead Connection Status: 753985
Implantable Lead Implant Date: 20121031
Implantable Lead Implant Date: 20121031
Implantable Lead Location: 753859
Implantable Lead Location: 753860
Implantable Lead Model: 5092
Implantable Lead Model: 5592
Implantable Pulse Generator Implant Date: 20230824
Lead Channel Impedance Value: 361 Ohm
Lead Channel Impedance Value: 399 Ohm
Lead Channel Impedance Value: 399 Ohm
Lead Channel Impedance Value: 513 Ohm
Lead Channel Pacing Threshold Amplitude: 0.5 V
Lead Channel Pacing Threshold Pulse Width: 0.4 ms
Lead Channel Sensing Intrinsic Amplitude: 2.75 mV
Lead Channel Sensing Intrinsic Amplitude: 2.75 mV
Lead Channel Sensing Intrinsic Amplitude: 6.375 mV
Lead Channel Sensing Intrinsic Amplitude: 6.375 mV
Lead Channel Setting Pacing Amplitude: 1.5 V
Lead Channel Setting Pacing Amplitude: 2.5 V
Lead Channel Setting Pacing Pulse Width: 0.4 ms
Lead Channel Setting Sensing Sensitivity: 0.9 mV
Zone Setting Status: 755011
Zone Setting Status: 755011

## 2023-02-06 ENCOUNTER — Other Ambulatory Visit: Payer: Self-pay | Admitting: Internal Medicine

## 2023-02-06 DIAGNOSIS — F419 Anxiety disorder, unspecified: Secondary | ICD-10-CM

## 2023-02-06 DIAGNOSIS — N390 Urinary tract infection, site not specified: Secondary | ICD-10-CM

## 2023-02-07 NOTE — Telephone Encounter (Signed)
Copied from CRM [id number]. Topic: General - Other >> Feb 06, 2023  3:56 PM Carrielelia G wrote: Reason for CRM: pt would like these medications to be transferred from Dr Juel Burrow name to Dr Caralee Ates amLODipine (NORVASC) 5 MG tablet atorvastatin (LIPITOR) 10 MG tablet EPINEPHrine 0.3 mg/0.3 mL IJ SOAJ injection

## 2023-02-07 NOTE — Telephone Encounter (Signed)
Requested medication (s) are due for refill today:   Provider to review  Requested medication (s) are on the active medication list:   Yes  Future visit scheduled:   Yes 06/24/2023 with Dr. Caralee Ates   Last ordered: Buspar 09/10/2022 #90, 1 refill;  Macrodantin-historic;  Pt is requesting the below medications be transferred from Dr. Juel Burrow to Dr. Caralee Ates: Amlodipine 5 mg Atorvastatin 10 mg Epinephrine 0.3 mg injector    Requested Prescriptions  Pending Prescriptions Disp Refills   busPIRone (BUSPAR) 10 MG tablet [Pharmacy Med Name: BUSPIRONE HCL 10 MG TABLET] 90 tablet 1    Sig: TAKE 1 TABLET BY MOUTH EVERY DAY     Psychiatry: Anxiolytics/Hypnotics - Non-controlled Passed - 02/07/2023  9:46 AM      Passed - Valid encounter within last 12 months    Recent Outpatient Visits           1 month ago Gastroesophageal reflux disease, unspecified whether esophagitis present   [facility] Margarita Mail, DO   4 months ago Anxiety   [facility] Margarita Mail, DO   5 months ago Anxiety   [facility] Margarita Mail, DO       Future Appointments             In 4 months Margarita Mail, DO [facility]             nitrofurantoin (MACRODANTIN) 100 MG capsule [Pharmacy Med Name: NITROFURANTOIN MCR 100 MG CAP] 60 capsule     Sig: TAKE 1 CAPSULE BY MOUTH TWICE A DAY     Off-Protocol Failed - 02/07/2023  9:46 AM      Failed - Medication not assigned to a protocol, review manually.      Passed - Valid encounter within last 12 months    Recent Outpatient Visits           1 month ago Gastroesophageal reflux disease, unspecified whether esophagitis present   [facility] Margarita Mail, DO   4 months ago Anxiety   [facility] Margarita Mail, DO   5 months ago Anxiety   [facility] Margarita Mail, DO       Future Appointments             In 4 months Margarita Mail, DO [facility], [facility]

## 2023-02-26 NOTE — Progress Notes (Signed)
Remote pacemaker transmission.   

## 2023-02-27 ENCOUNTER — Other Ambulatory Visit: Payer: Self-pay | Admitting: Internal Medicine

## 2023-02-27 MED ORDER — ATORVASTATIN CALCIUM 10 MG PO TABS
10.0000 mg | ORAL_TABLET | Freq: Every day | ORAL | 3 refills | Status: DC
Start: 2023-02-27 — End: 2024-02-25

## 2023-02-27 NOTE — Telephone Encounter (Signed)
Requested medication (s) are due for refill today: yes  Requested medication (s) are on the active medication list: yes  Last refill:  06/10/22 #90 3 RF  Future visit scheduled: yes  Notes to clinic:  last reordered by Dr De Hollingshead at [facility]- can we refill?   Requested Prescriptions  Pending Prescriptions Disp Refills   atorvastatin (LIPITOR) 10 MG tablet 90 tablet 3    Sig: Take 1 tablet (10 mg total) by mouth daily.     Cardiovascular:  Antilipid - Statins Failed - 02/27/2023  8:37 AM      Failed - Lipid Panel in normal range within the last 12 months    Cholesterol  Date Value Ref Range Status  04/30/2022 146 <200 mg/dL Final   LDL Cholesterol (Calc)  Date Value Ref Range Status  04/30/2022 69 mg/dL (calc) Final    Comment:    Reference range: <100 . Desirable range <100 mg/dL for primary prevention;   <70 mg/dL for patients with CHD or diabetic patients  with > or = 2 CHD risk factors. Marland Kitchen LDL-C is now calculated using the Martin-Hopkins  calculation, which is a validated novel method providing  better accuracy than the Friedewald equation in the  estimation of LDL-C.  Horald Pollen et al. Lenox Ahr. 1610;960(45): 2061-2068  (http://education.[employer].com/faq/FAQ164)    HDL  Date Value Ref Range Status  04/30/2022 62 > OR = 50 mg/dL Final   Triglycerides  Date Value Ref Range Status  04/30/2022 72 <150 mg/dL Final         Passed - Patient is not pregnant      Passed - Valid encounter within last 12 months    Recent Outpatient Visits           2 months ago Gastroesophageal reflux disease, unspecified whether esophagitis present   [facility] Margarita Mail, DO   5 months ago Anxiety   [facility] Margarita Mail, DO   6 months ago Anxiety   [facility] Margarita Mail, DO       Future Appointments             In 3 months Margarita Mail,  DO [facility], [facility]

## 2023-02-27 NOTE — Telephone Encounter (Signed)
Copied from CRM [id number]. Topic: General - Other >> Feb 27, 2023  8:24 AM Everette C wrote: Reason for CRM: Medication Refill - Medication: atorvastatin (LIPITOR) 10 MG tablet [[id number]]  Has the patient contacted their pharmacy? Yes.   (Agent: If no, request that the patient contact the pharmacy for the refill. If patient does not wish to contact the pharmacy document the reason why and proceed with request.) (Agent: If yes, when and what did the pharmacy advise?)  Preferred Pharmacy (with phone number or street name): [facility] [address] [place] - [address] [place] [address] Phone: [phone] Fax: [phone] Hours: Not open 24 hours   Has the patient been seen for an appointment in the last year OR does the patient have an upcoming appointment? Yes.    Agent: Please be advised that RX refills may take up to 3 business days. We ask that you follow-up with your pharmacy.

## 2023-03-04 ENCOUNTER — Ambulatory Visit (INDEPENDENT_AMBULATORY_CARE_PROVIDER_SITE_OTHER): Payer: Medicare Other | Admitting: Internal Medicine

## 2023-03-04 ENCOUNTER — Encounter: Payer: Self-pay | Admitting: Internal Medicine

## 2023-03-04 ENCOUNTER — Ambulatory Visit
Admission: RE | Admit: 2023-03-04 | Discharge: 2023-03-04 | Disposition: A | Discharge status: Home / Self Care | Payer: Medicare Other | Source: Ambulatory Visit | Attending: Internal Medicine | Admitting: Internal Medicine

## 2023-03-04 ENCOUNTER — Ambulatory Visit
Admission: RE | Admit: 2023-03-04 | Discharge: 2023-03-04 | Disposition: A | Discharge status: Home / Self Care | Payer: Medicare Other | Attending: Internal Medicine | Admitting: Internal Medicine

## 2023-03-04 ENCOUNTER — Ambulatory Visit: Payer: Self-pay | Admitting: *Deleted

## 2023-03-04 VITALS — BP 136/80 | HR 83 | Temp 97.9°F | Resp 16 | Ht 62.0 in | Wt 138.0 lb

## 2023-03-04 DIAGNOSIS — M25462 Effusion, left knee: Secondary | ICD-10-CM | POA: Insufficient documentation

## 2023-03-04 DIAGNOSIS — M25562 Pain in left knee: Secondary | ICD-10-CM | POA: Diagnosis not present

## 2023-03-04 NOTE — Patient Instructions (Addendum)
It was great seeing you today!  Plan discussed at today's visit: -Please get x-ray today, see info below about bursitis  -Plan for fasting labs at follow up  Follow up in: already scheduled  Take care and let us know if you have any questions or concerns prior to your next visit.  Dr. Caralee Ates  Bursitis  Bursitis is when the fluid-filled sac (bursa) that covers and protects a joint is swollen (inflamed). Bursitis is most common near joints such as the knees, elbows, hips, and shoulders. It can cause pain and stiffness. What are the causes? An injury to a joint area. Repeated use of a joint. Infection. Certain conditions that cause swelling. What increases the risk? Putting stress on a joint over and over again. Having a condition that weakens your body's defense system (immune system). Doing any of these often: Lifting and reaching overhead. Kneeling or leaning on hard surfaces. Doing activities that have a motion that you do over and over again. This includes running and walking. What are the signs or symptoms? Common symptoms of this condition include: Pain that gets worse when you move the affected body part or use it to support your body weight. Irritation and swelling (inflammation). Stiffness. Other symptoms include: Redness. Swelling. Tenderness. Warmth. Pain that stays after rest. Fever or chills if there is an infection. How is this treated? This condition can often be treated at home with: Rest. Ice. Wrapping the area with an elastic bandage (compression). Keeping the affected area raised (elevation). Other treatments may include: Medicine for pain and swelling. Shots of medicine to the area to lessen swelling. Draining fluid out of the bursa. Antibiotic medicine for infection. Using a splint, brace, wrap, pads, or walking aid. Therapy if pain continues or you have limited movement. Surgery. Follow these instructions at home: Medicines Take  over-the-counter and prescription medicines only as told by your doctor. If you were prescribed an antibiotic medicine, take it as told by your doctor. Do not stop taking it even if you start to feel better. Managing pain, stiffness, and swelling     Raise the injured area above the level of your heart while you are sitting or lying down. If told, put ice on the affected area. To do this: Put ice in a plastic bag. Place a towel between your skin and the bag, or between your splint or brace and the bag. Leave the ice on for 20 minutes, 2-3 times a day. Take off the ice if your skin turns bright red. This is very important. If you cannot feel pain, heat, or cold, you have a greater risk of damage to the area. If told, put heat on the affected area. Do this as often as told by your doctor. Use the heat source that your doctor recommends, such as a moist heat pack or a heating pad. Place a towel between your skin and the heat source. Leave the heat on for 20-30 minutes. Take off the heat if your skin turns bright red. This is very important. If you cannot feel pain, heat, or cold, you have a greater risk of getting burned. General instructions Rest the affected area as told by your doctor. Avoid doing things that make the pain worse. Use a splint, brace, pad, wrap, or walking aid as told by your doctor. Keep all follow-up visits. Preventing symptoms Wear knee pads if you kneel often. Wear running or walking shoes that fit you well. Take a lot of breaks during activities that involve  doing the same movements again and again. Before you do any activity that takes a lot of effort, get your body ready by stretching. Stay at a healthy weight or lose weight if your doctor says you should. If you need help doing this, ask your doctor. Exercise often. If you start any new physical activity, do it slowly. Work with your physical or occupational therapist and doctor to find what caused the  bursitis. Contact a doctor if: You have a fever or chills. You have symptoms that do not get better with treatment. You have pain or swelling that: Gets worse. Goes away and then comes back. You have pus coming from the affected area. You have redness around the affected area. The affected area is warm to the touch. Summary Bursitis is when the fluid-filled sac (bursa) that covers and protects a joint is swollen. Rest the affected area as told by your doctor. Avoid doing things that make the pain worse. Put ice on the affected area as told by your doctor. This information is not intended to replace advice given to you by your health care provider. Make sure you discuss any questions you have with your health care provider. Document Revised: 10/02/2021 Document Reviewed: 10/02/2021 Elsevier Patient Education  2023 [employer].

## 2023-03-04 NOTE — Telephone Encounter (Signed)
  Chief Complaint: knee swelling Symptoms: fell 2 weeks ago and now noted knee swelling bilateral knees comes and goes. Left knee inner knee with "knot". Difficulty climbing stairs. Using tylenol arthritis and ace wraps. Frequency: 2 weeks ago  Pertinent Negatives: Patient denies leg swelling no chest pain no difficulty breathing  Disposition: [] ED /[] Urgent Care (no appt availability in office) / [x] Appointment(In office/virtual)/ []  [facility]/ [] Home Care/ [] Refused Recommended Disposition /[] [facility] [facility]/ []  Follow-up with PCP Additional Notes:   Appt today . Called patient's daughter per patient request to confirm appt due to that is her transportation.    Reason for Disposition  MILD or MODERATE swelling (e.g., can't move joint normally, can't do usual activities) (Exceptions: Itchy, localized swelling; swelling is chronic.)  Answer Assessment - Initial Assessment Questions 1. LOCATION: "Where is the swelling located?"  (e.g., left, right, both knees)     Bilateral knees swelling left knee with "knot" 2. ONSET: "When did the swelling start?" "Does it come and go, or is it there all the time?"     Comes and goes  3. SWELLING: "How bad is the swelling?" Or, "How large is it?" (e.g., mild, moderate, severe; size of localized swelling)    - NONE: No joint swelling.   - LOCALIZED: Localized; small area of puffy or swollen skin (e.g., insect bite, skin irritation).   - MILD: Joint looks or feels mildly swollen or puffy.   - MODERATE: Swollen; interferes with normal activities (e.g., work or school); can't move joint normally (bend and straighten completely); may be limping.   - SEVERE: Very swollen; can't move swollen joint at all; limping a lot or unable to walk.     Swelling to mid part of leg 4. PAIN: "Is there any pain?" If Yes, ask: "How bad is it?" (Scale 1-10; or mild, moderate, severe)   - NONE (0): no pain.   - MILD (1-3): doesn't interfere with normal  activities.    - MODERATE (4-7): interferes with normal activities (e.g., work or school) or awakens from sleep, limping.    - SEVERE (8-10): excruciating pain, unable to do any normal activities, unable to walk.      Denies pain 5. SETTING: "Has there been any recent work, exercise or other activity that involved that part of the body?"      Fall 2 weeks ago  6. AGGRAVATING FACTORS: "What makes the knee swelling worse?" (e.g., walking, climbing stairs, running)     Difficulty climbing stairs  7. ASSOCIATED SYMPTOMS: "Is there any pain or redness?"     No  8. OTHER SYMPTOMS: "Do you have any other symptoms?" (e.g., chest pain, difficulty breathing, fever, calf pain)     No  9. PREGNANCY: "Is there any chance you are pregnant?" "When was your last menstrual period?"     na  Protocols used: Knee Swelling-A-AH

## 2023-03-04 NOTE — Progress Notes (Signed)
   Acute Office Visit  Subjective:     Patient ID: Jessica Browning, female    DOB: 1934-07-26, 89 y.o.   MRN: [id number]  Chief Complaint  Patient presents with   Joint Swelling    HPI Patient is in today for knee swelling.  She is here with her daughter.  Last week she was getting dressed for church when she fell on both knees.  Since then she has noticed a large amount of swelling, particularly on the medial aspect of her left knee.  She does have minor amount of swelling on her right knee.  She denies any pain except for with prolonged walking.  She has tried ice and Ace wraps for the swelling.  She does take Tylenol as needed for pain but states she really has not had to take it.  She denies any fevers, erythema or joints feeling warm to the touch.   Review of Systems  Constitutional:  Negative for chills and fever.  Musculoskeletal:  Negative for joint pain.  Skin: Negative.         Objective:    BP 136/80   Pulse 83   Temp 97.9 F (36.6 C)   Resp 16   Ht 5\' 2"  (1.575 m)   Wt 138 lb (62.6 kg)   SpO2 95%   BMI 25.24 kg/m  BP Readings from Last 3 Encounters:  03/04/23 136/80  12/20/22 (!) 140/84  09/20/22 136/78   Wt Readings from Last 3 Encounters:  03/04/23 138 lb (62.6 kg)  12/20/22 140 lb 9.6 oz (63.8 kg)  09/20/22 140 lb 8 oz (63.7 kg)      Physical Exam Constitutional:      Appearance: Normal appearance.  HENT:     Head: Normocephalic and atraumatic.  Eyes:     Conjunctiva/sclera: Conjunctivae normal.  Cardiovascular:     Rate and Rhythm: Normal rate and regular rhythm.  Pulmonary:     Effort: Pulmonary effort is normal.     Breath sounds: Normal breath sounds.  Musculoskeletal:        General: Swelling present. No tenderness.     Right knee: Swelling present. No erythema or bony tenderness. No tenderness.     Left knee: Swelling present. No erythema or bony tenderness. No tenderness.  Skin:    General: Skin is warm and dry.  Neurological:      General: No focal deficit present.     Mental Status: She is alert. Mental status is at baseline.  Psychiatric:        Mood and Affect: Mood normal.        Behavior: Behavior normal.     No results found for any visits on 03/04/23.      Assessment & Plan:   1. Swelling of left knee joint: X-ray of left knee obtained due to swelling and history of recent fall.  Patient is not having any pain or any inflammation.  Will continue to use compression brace or wrap as well as ice and heat to help improve swelling.  Information about bursitis printed and given to the patient.  Follow-up if symptoms worsen or fail to improve.  - DG Knee Complete 4 Views Left; Future  Return for already scheduled .  Margarita Mail, DO

## 2023-03-10 ENCOUNTER — Telehealth: Payer: Self-pay | Admitting: Internal Medicine

## 2023-03-10 NOTE — Telephone Encounter (Signed)
notified

## 2023-03-10 NOTE — Telephone Encounter (Signed)
Copied from CRM [id number]. Topic: General - Inquiry >> Mar 10, 2023  9:06 AM [name] wrote: Reason for CRM: pt called saying she had an xray last Tuesday and she or her daughter Jessica Browning has not been called [phone].

## 2023-04-25 ENCOUNTER — Ambulatory Visit (INDEPENDENT_AMBULATORY_CARE_PROVIDER_SITE_OTHER): Payer: 59

## 2023-04-25 DIAGNOSIS — I495 Sick sinus syndrome: Secondary | ICD-10-CM

## 2023-04-25 LAB — CUP PACEART REMOTE DEVICE CHECK
Battery Remaining Longevity: 155 mo
Battery Voltage: 3.13 V
Brady Statistic AP VP Percent: 0.02 %
Brady Statistic AP VS Percent: 96.4 %
Brady Statistic AS VP Percent: 0 %
Brady Statistic AS VS Percent: 3.58 %
Brady Statistic RA Percent Paced: 96.38 %
Brady Statistic RV Percent Paced: 0.02 %
Date Time Interrogation Session: 20240704212618
Implantable Lead Connection Status: 753985
Implantable Lead Connection Status: 753985
Implantable Lead Implant Date: 20121031
Implantable Lead Implant Date: 20121031
Implantable Lead Location: 753859
Implantable Lead Location: 753860
Implantable Lead Model: 5092
Implantable Lead Model: 5592
Implantable Pulse Generator Implant Date: 20230824
Lead Channel Impedance Value: 380 Ohm
Lead Channel Impedance Value: 399 Ohm
Lead Channel Impedance Value: 418 Ohm
Lead Channel Impedance Value: 513 Ohm
Lead Channel Pacing Threshold Amplitude: 0.75 V
Lead Channel Pacing Threshold Pulse Width: 0.4 ms
Lead Channel Sensing Intrinsic Amplitude: 3.125 mV
Lead Channel Sensing Intrinsic Amplitude: 3.125 mV
Lead Channel Sensing Intrinsic Amplitude: 7.125 mV
Lead Channel Sensing Intrinsic Amplitude: 7.125 mV
Lead Channel Setting Pacing Amplitude: 1.5 V
Lead Channel Setting Pacing Amplitude: 2.5 V
Lead Channel Setting Pacing Pulse Width: 0.4 ms
Lead Channel Setting Sensing Sensitivity: 0.9 mV
Zone Setting Status: 755011
Zone Setting Status: 755011

## 2023-05-09 NOTE — Progress Notes (Signed)
Remote pacemaker transmission.   

## 2023-05-19 ENCOUNTER — Ambulatory Visit: Payer: Self-pay | Admitting: *Deleted

## 2023-05-19 NOTE — Telephone Encounter (Signed)
  Chief Complaint: dizziness Symptoms: dizziness-off balance- comes and goes- 2 weeks Frequency: comes and goes Pertinent Negatives: Patient denies , fever, chest pain, vomiting, diarrhea, bleeding  Disposition: [] ED /[] Urgent Care (no appt availability in office) / [x] Appointment(In office/virtual)/ []  [facility]/ [] Home Care/ [] Refused Recommended Disposition /[] [facility]/ []  Follow-up with PCP Additional Notes: Patient's daughter is calling to report new onset dizziness for 2 weeks- off/no- appointment has been scheduled for evaluation. She does know to get immediate help symptoms change or get worse.

## 2023-05-19 NOTE — Telephone Encounter (Signed)
Summary: Diziness   Pt's daughter Elita Quick is calling in because pt has been experiencing dizziness. Pt has an appointment on 08/06 and Pam wants to know what she can take in the meantime.     Reason for Disposition  [1] MILD dizziness (e.g., walking normally) AND [2] has NOT been evaluated by doctor (or NP/PA) for this  (Exception: Dizziness caused by heat exposure, sudden standing, or poor fluid intake.)  Answer Assessment - Initial Assessment Questions 1. DESCRIPTION: "Describe your dizziness."     Woozy-"drunk feeling", off balance 2. LIGHTHEADED: "Do you feel lightheaded?" (e.g., somewhat faint, woozy, weak upon standing)     no 3. VERTIGO: "Do you feel like either you or the room is spinning or tilting?" (i.e. vertigo)     no 4. SEVERITY: "How bad is it?"  "Do you feel like you are going to faint?" "Can you stand and walk?"   - MILD: Feels slightly dizzy, but walking normally.   - MODERATE: Feels unsteady when walking, but not falling; interferes with normal activities (e.g., school, work).   - SEVERE: Unable to walk without falling, or requires assistance to walk without falling; feels like passing out now.      Mild- may hold on at times 5. ONSET:  "When did the dizziness begin?"     2 weeks- comes and goes 6. AGGRAVATING FACTORS: "Does anything make it worse?" (e.g., standing, change in head position)     no 7. HEART RATE: "Can you tell me your heart rate?" "How many beats in 15 seconds?"  (Note: not all patients can do this)       Patient has pacemaker-will check BP/P 8. CAUSE: "What do you think is causing the dizziness?"     Unsure- recent change in anxiety medication 9. RECURRENT SYMPTOM: "Have you had dizziness before?" If Yes, ask: "When was the last time?" "What happened that time?"     Yes- years ago- vertigo 10. OTHER SYMPTOMS: "Do you have any other symptoms?" (e.g., fever, chest pain, vomiting, diarrhea, bleeding)       no  Protocols used: Dizziness -  Lightheadedness-A-AH

## 2023-05-20 ENCOUNTER — Ambulatory Visit
Admission: RE | Admit: 2023-05-20 | Discharge: 2023-05-20 | Disposition: A | Discharge status: Home / Self Care | Payer: Medicare Other | Source: Ambulatory Visit | Attending: Nurse Practitioner | Admitting: Nurse Practitioner

## 2023-05-20 ENCOUNTER — Encounter: Payer: Self-pay | Admitting: Nurse Practitioner

## 2023-05-20 ENCOUNTER — Ambulatory Visit: Payer: Medicare Other | Admitting: Family Medicine

## 2023-05-20 ENCOUNTER — Ambulatory Visit (INDEPENDENT_AMBULATORY_CARE_PROVIDER_SITE_OTHER): Payer: Medicare Other | Admitting: Nurse Practitioner

## 2023-05-20 ENCOUNTER — Other Ambulatory Visit: Payer: Self-pay

## 2023-05-20 VITALS — BP 120/72 | HR 85 | Temp 98.1°F | Resp 16 | Ht 62.0 in | Wt 140.4 lb

## 2023-05-20 DIAGNOSIS — R2681 Unsteadiness on feet: Secondary | ICD-10-CM

## 2023-05-20 DIAGNOSIS — R42 Dizziness and giddiness: Secondary | ICD-10-CM | POA: Insufficient documentation

## 2023-05-20 LAB — CBC WITH DIFFERENTIAL/PLATELET
Absolute Monocytes: 1051 cells/uL — ABNORMAL HIGH (ref 200–950)
Basophils Absolute: 51 cells/uL (ref 0–200)
Basophils Relative: 0.7 %
Eosinophils Absolute: 183 cells/uL (ref 15–500)
Eosinophils Relative: 2.5 %
HCT: 37.7 % (ref 35.0–45.0)
Hemoglobin: 12.4 g/dL (ref 11.7–15.5)
Lymphs Abs: 2270 cells/uL (ref 850–3900)
MCH: 29.4 pg (ref 27.0–33.0)
MCHC: 32.9 g/dL (ref 32.0–36.0)
MCV: 89.3 fL (ref 80.0–100.0)
MPV: 10.9 fL (ref 7.5–12.5)
Monocytes Relative: 14.4 %
Neutro Abs: 3745 cells/uL (ref 1500–7800)
Neutrophils Relative %: 51.3 %
Platelets: 294 10*3/uL (ref 140–400)
RBC: 4.22 10*6/uL (ref 3.80–5.10)
RDW: 12.7 % (ref 11.0–15.0)
Total Lymphocyte: 31.1 %
WBC: 7.3 10*3/uL (ref 3.8–10.8)

## 2023-05-20 NOTE — Progress Notes (Addendum)
BP 120/72   Pulse 85   Temp 98.1 F (36.7 C) (Oral)   Resp 16   Ht 5\' 2"  (1.575 m)   Wt 140 lb 6.4 oz (63.7 kg)   SpO2 96%   BMI 25.68 kg/m    Subjective:    Patient ID: Jessica Browning, female    DOB: June 11, 1934, 89 y.o.   MRN: [id number]  HPI: Jessica Browning is a 89 y.o. female  Chief Complaint  Patient presents with   Dizziness    Feel drunk, head floating for 2 weeks   Dizziness: started two weeks ago.  She says that every morning she wakes up dizzy. She says she feels drunk, like she can not walk straight and that she would fall.  She says she does get more dizzy when she moves her bed.  She denies any headache, shortness of breath,  or chest pain. She denies any recent illness.  She says she feels like her eyes are dizzy.  Upon exam she had an unsteady gait, positive Romberg test.  Equal grip strength, no arm drift.  Will get stat head ct,labs and refer to neurology.   Orthostatics Laying: 106/68 , 74 Sitting: 120/72 , 85 Standing: 110/72, 74  Relevant past medical, surgical, family and social history reviewed and updated as indicated. Interim medical history since our last visit reviewed. Allergies and medications reviewed and updated.  Review of Systems  Constitutional: Negative for fever or weight change.  Respiratory: Negative for cough and shortness of breath.   Cardiovascular: Negative for chest pain or palpitations.  Gastrointestinal: Negative for abdominal pain, no bowel changes.  Musculoskeletal: Negative for gait problem or joint swelling.  Skin: Negative for rash.  Neurological: positive for dizziness, negative for headache.  No other specific complaints in a complete review of systems (except as listed in HPI above).      Objective:    BP 120/72   Pulse 85   Temp 98.1 F (36.7 C) (Oral)   Resp 16   Ht 5\' 2"  (1.575 m)   Wt 140 lb 6.4 oz (63.7 kg)   SpO2 96%   BMI 25.68 kg/m   Wt Readings from Last 3 Encounters:  05/20/23 140 lb 6.4  oz (63.7 kg)  03/04/23 138 lb (62.6 kg)  12/20/22 140 lb 9.6 oz (63.8 kg)    Physical Exam  Constitutional: Patient appears well-developed and well-nourished.  No distress.  HEENT: head atraumatic, normocephalic, pupils equal and reactive to light, ears TMs clear, neck supple, throat within normal limits Cardiovascular: Normal rate, regular rhythm and normal heart sounds.  No murmur heard. No BLE edema. Pulmonary/Chest: Effort normal and breath sounds normal. No respiratory distress. Abdominal: Soft.  There is no tenderness. Neuro:  postive romberg test, equal grip strength, unsteady gait, no arm drift Psychiatric: Patient has a normal mood and affect. behavior is normal. Judgment and thought content normal.  Results for orders placed or performed in visit on 04/25/23  CUP PACEART REMOTE DEVICE CHECK  Result Value Ref Range   Date Time Interrogation Session [phone]    Pulse Generator Manufacturer MERM    Pulse Gen Model [id number] Azure XT DR MRI    Pulse Gen Serial Number [id number]    Clinic Name [facility]    Implantable Pulse Generator Type Implantable Pulse Generator    Implantable Pulse Generator Implant Date [id number]    Implantable Lead Manufacturer MERM    Implantable Lead Model 5092 CapSure SP Novus  Implantable Lead Serial Number [id number]    Implantable Lead Implant Date 40981191    Implantable Lead Location Detail 1 UNKNOWN    Implantable Lead Location [id number]    Implantable Lead Connection Status 478295    Implantable Lead Manufacturer MERM    Implantable Lead Model 5592 CapSure SP Novus    Implantable Lead Serial Number [id number]    Implantable Lead Implant Date 84696295    Implantable Lead Location Detail 1 UNKNOWN    Implantable Lead Location [id number]    Implantable Lead Connection Status [phone]    Lead Channel Setting Sensing Sensitivity 0.9 mV   Lead Channel Setting Pacing Amplitude 1.5 V   Lead Channel Setting Pacing Pulse Width 0.4 ms   Lead Channel  Setting Pacing Amplitude 2.5 V   Zone Setting Status 755011    Zone Setting Status [phone]    Lead Channel Impedance Value 513 ohm   Lead Channel Impedance Value 418 ohm   Lead Channel Sensing Intrinsic Amplitude 3.125 mV   Lead Channel Sensing Intrinsic Amplitude 3.125 mV   Lead Channel Pacing Threshold Amplitude 0.75 V   Lead Channel Pacing Threshold Pulse Width 0.4 ms   Lead Channel Impedance Value 399 ohm   Lead Channel Impedance Value 380 ohm   Lead Channel Sensing Intrinsic Amplitude 7.125 mV   Lead Channel Sensing Intrinsic Amplitude 7.125 mV   Battery Status OK    Battery Remaining Longevity 155 mo   Battery Voltage 3.13 V   Brady Statistic RA Percent Paced 96.38 %   Brady Statistic RV Percent Paced 0.02 %   Brady Statistic AP VP Percent 0.02 %   Brady Statistic AS VP Percent 0 %   Brady Statistic AP VS Percent 96.4 %   Brady Statistic AS VS Percent 3.58 %      Assessment & Plan:   Problem List Items Addressed This Visit   None Visit Diagnoses     Dizziness    -  Primary   ekg, orthostatics normal, stat head ct, referral to neurology   Relevant Orders   Orthostatic vital signs   EKG 12-Lead (Completed)   CT HEAD WO CONTRAST ( )   Ambulatory referral to Neurology   CBC with Differential/Platelet   COMPLETE METABOLIC PANEL WITH GFR   Unsteady gait       stat head ct, referral to neurology   Relevant Orders   CT HEAD WO CONTRAST ( )   Ambulatory referral to Neurology   CBC with Differential/Platelet   COMPLETE METABOLIC PANEL WITH GFR        Follow up plan: Return if symptoms worsen or fail to improve.

## 2023-05-20 NOTE — Addendum Note (Signed)
Addended by: Della Goo F on: 05/20/2023 11:37 AM   Modules accepted: Orders

## 2023-05-26 NOTE — Progress Notes (Unsigned)
Established Patient Office Visit  Subjective    Patient ID: Jessica Browning, female    DOB: 1934-10-08  Age: 89 y.o. MRN: [id number]  CC:  No chief complaint on file.   HPI Jessica Browning presents to follow up. She is here with her daughter. Was seen here on 05/20/23 complaining of dizziness and "feeling drunk". CT head showed aging brain without acute insult, labs normal. Referral to Neurology placed.   She would also like to discuss acid reflux symptoms. Patient states she has been having burning like sensation and increased burping and gas, mainly at night. She denies abdominal pain, nausea, vomiting or decrease in appetite.   Hypertension/Sinus Sick Syndrome: -Medications: Amlodipine 5 mg -Patient is compliant with above medications and reports no side effects. -Checking BP at home (average): doesn't check -Denies any SOB, CP, vision changes, LE edema or symptoms of hypotension -Following with Cardiology, last note reviewed from 06/05/22. Pacemaker placed in August 2023  HLD: -Medications: Lipitor 10 mg, aspirin 81 mg -Patient is compliant with above medications and reports no side effects.  -Last lipid panel: Lipid Panel     Component Value Date/Time   CHOL 146 04/30/2022 1639   TRIG 72 04/30/2022 1639   HDL 62 04/30/2022 1639   CHOLHDL 2.4 04/30/2022 1639   LDLCALC 69 04/30/2022 1639   Anxiety: -Currently on Buspar 10 mg and doing well -Had been on Xanax 0.25 mg before bed every day for the last 20+ years but she tapered off this successfully      05/20/2023   10:41 AM 03/04/2023    1:56 PM 12/20/2022   12:58 PM 09/20/2022   12:59 PM 09/17/2022    1:41 PM  Depression screen PHQ 2/9  Decreased Interest 0 0 0 0 1  Down, Depressed, Hopeless 0 0 1 0 1  PHQ - 2 Score 0 0 1 0 2  Altered sleeping 0 0 0 0   Tired, decreased energy 0 0 0 0   Change in appetite 0 0 0 0   Feeling bad or failure about yourself  0 0 0 0   Trouble concentrating 0 0 0 0   Moving slowly  or fidgety/restless 0 0 0 0   Suicidal thoughts 0 0 0 0   PHQ-9 Score 0 0 1 0   Difficult doing work/chores Not difficult at all Not difficult at all  Not difficult at all    Also has a history of recurrent UTI's, has been on Nitrofurantoin 100 mg at bedtime for years for suppression therapy. Notes reviewed from Urogynecology  reviewed from 05/01/21. No symptoms since being on the suppression therapy.   Health maintenance: -Blood work up-to-date -Tdap due   Outpatient Encounter Medications as of 05/27/2023  Medication Sig   amLODipine (NORVASC) 5 MG tablet Take 1 tablet (5 mg total) by mouth daily.   aspirin EC 81 MG tablet Take 81 mg by mouth daily. Swallow whole.   atorvastatin (LIPITOR) 10 MG tablet Take 1 tablet (10 mg total) by mouth daily.   busPIRone (BUSPAR) 10 MG tablet TAKE 1 TABLET BY MOUTH EVERY DAY   Calcium Carb-Cholecalciferol 600-10 MG-MCG TABS Take 2 tablets by mouth daily.   docusate sodium (COLACE) 100 MG capsule Take 100 mg by mouth 2 (two) times daily.   EPINEPHrine 0.3 mg/0.3 mL IJ SOAJ injection Inject 0.3 mg into the muscle as needed for anaphylaxis.   famotidine (PEPCID) 20 MG tablet Take 1 tablet (20 mg total) by mouth  daily.   nitrofurantoin (MACRODANTIN) 100 MG capsule TAKE 1 CAPSULE BY MOUTH TWICE A DAY   No facility-administered encounter medications on file as of 05/27/2023.    Past Medical History:  Diagnosis Date   Anxiety    Basal cell carcinoma 06/18/2016   Posterior neck. Nodular pattern.   Hyperlipidemia    Hypertension     Past Surgical History:  Procedure Laterality Date   ABDOMINAL HYSTERECTOMY     APPENDECTOMY     BIV PACEMAKER GENERATOR CHANGEOUT N/A 06/13/2022   Procedure: BIV PACEMAKER GENERATOR CHANGEOUT;  Surgeon: Lanier Prude, MD;  Location: [facility];  Service: Cardiovascular;  Laterality: N/A;   BLADDER SURGERY     CATARACT EXTRACTION     VEIN LIGATION AND STRIPPING     bilateral legs    Family History  Problem  Relation Age of Onset   Pancreatic disease Mother    COPD Sister    Rheum arthritis Sister    Hypertension Daughter    Hyperlipidemia Daughter    Hyperlipidemia Son    Hypertension Son    Heart attack Son     Social History   Socioeconomic History   Marital status: Widowed    Spouse name: Not on file   Number of children: 6   Years of education: Not on file   Highest education level: Not on file  Occupational History   Occupation: Retired  Tobacco Use   Smoking status: Never   Smokeless tobacco: Never  Vaping Use   Vaping status: Never Used  Substance and Sexual Activity   Alcohol use: Never   Drug use: Never   Sexual activity: Not Currently  Other Topics Concern   Not on file  Social History Narrative   Not on file   Social Determinants of Health   Financial Resource Strain: Low Risk  (07/12/2021)   Overall Financial Resource Strain (CARDIA)    Difficulty of Paying Living Expenses: Not very hard  Food Insecurity: No Food Insecurity (09/17/2022)   Hunger Vital Sign    Worried About Running Out of Food in the Last Year: Never true    Ran Out of Food in the Last Year: Never true  Transportation Needs: No Transportation Needs (09/17/2022)   PRAPARE - Administrator, Civil Service (Medical): No    Lack of Transportation (Non-Medical): No  Physical Activity: Insufficiently Active (07/12/2021)   Exercise Vital Sign    Days of Exercise per Week: 2 days    Minutes of Exercise per Session: 20 min  Stress: Stress Concern Present (09/17/2022)   [employer] of Occupational Health - Occupational Stress Questionnaire    Feeling of Stress : To some extent  Social Connections: Moderately Integrated (09/17/2022)   Social Connection and Isolation Panel [NHANES]    Frequency of Communication with Friends and Family: More than three times a week    Frequency of Social Gatherings with Friends and Family: Once a week    Attends Religious Services: More than 4 times  per year    Active Member of [employer] or Organizations: Yes    Attends Banker Meetings: More than 4 times per year    Marital Status: Widowed  Intimate Partner Violence: Not At Risk (09/17/2022)   Humiliation, Afraid, Rape, and Kick questionnaire    Fear of Current or Ex-Partner: No    Emotionally Abused: No    Physically Abused: No    Sexually Abused: No    Review of Systems  Constitutional:  Negative for chills and fever.  Eyes:  Negative for blurred vision.  Respiratory:  Negative for shortness of breath.   Cardiovascular:  Negative for chest pain.  Gastrointestinal:  Positive for heartburn. Negative for abdominal pain, nausea and vomiting.  Neurological:  Negative for dizziness and headaches.  Psychiatric/Behavioral:  The patient is not nervous/anxious and does not have insomnia.        Objective    There were no vitals taken for this visit.  Physical Exam Constitutional:      Appearance: Normal appearance.  HENT:     Head: Normocephalic and atraumatic.  Eyes:     Conjunctiva/sclera: Conjunctivae normal.  Cardiovascular:     Rate and Rhythm: Normal rate and regular rhythm.  Pulmonary:     Effort: Pulmonary effort is normal.     Breath sounds: Normal breath sounds.  Musculoskeletal:     Right lower leg: No edema.     Left lower leg: No edema.  Skin:    General: Skin is warm and dry.  Neurological:     General: No focal deficit present.     Mental Status: She is alert. Mental status is at baseline.  Psychiatric:        Mood and Affect: Mood normal.        Behavior: Behavior normal.       Assessment & Plan:   1. Essential hypertension: Chronic, slightly elevated today. Continue Amlodipine 5 mg.   2. Mixed hyperlipidemia: Stable, continue Lipitor 10 mg.  3. Gastroesophageal reflux disease, unspecified whether esophagitis present: Start Pepcid 20 mg daily. Discussed avoiding triggering foods and avoiding laying down 3-4 hours after eating.  -  famotidine (PEPCID) 20 MG tablet; Take 1 tablet (20 mg total) by mouth daily.  Dispense: 90 tablet; Refill: 1  4. Anxiety: Stable, doing well on Buspar 10 mg.  5. Forgetfulness: Mini mental exam today consistent with age related forgetfulness, continue to monitor.    No follow-ups on file.   Margarita Mail, DO

## 2023-05-27 ENCOUNTER — Ambulatory Visit (INDEPENDENT_AMBULATORY_CARE_PROVIDER_SITE_OTHER): Payer: Medicare Other | Admitting: Internal Medicine

## 2023-05-27 ENCOUNTER — Encounter: Payer: Self-pay | Admitting: Internal Medicine

## 2023-05-27 VITALS — BP 136/80 | HR 87 | Temp 97.9°F | Resp 16 | Ht 62.0 in | Wt 142.0 lb

## 2023-05-27 DIAGNOSIS — F419 Anxiety disorder, unspecified: Secondary | ICD-10-CM | POA: Diagnosis not present

## 2023-05-27 DIAGNOSIS — N390 Urinary tract infection, site not specified: Secondary | ICD-10-CM | POA: Diagnosis not present

## 2023-05-27 DIAGNOSIS — R42 Dizziness and giddiness: Secondary | ICD-10-CM | POA: Diagnosis not present

## 2023-05-27 DIAGNOSIS — I10 Essential (primary) hypertension: Secondary | ICD-10-CM

## 2023-05-27 DIAGNOSIS — K219 Gastro-esophageal reflux disease without esophagitis: Secondary | ICD-10-CM | POA: Diagnosis not present

## 2023-05-27 MED ORDER — FAMOTIDINE 20 MG PO TABS: 20.0000 mg | ORAL_TABLET | Freq: Every day | ORAL | 1 refills | Status: DC

## 2023-05-27 MED ORDER — NITROFURANTOIN MACROCRYSTAL 100 MG PO CAPS
100.0000 mg | ORAL_CAPSULE | Freq: Two times a day (BID) | ORAL | 1 refills | Status: DC
Start: 2023-05-27 — End: 2023-09-11

## 2023-05-27 MED ORDER — AMLODIPINE BESYLATE 5 MG PO TABS: 5.0000 mg | ORAL_TABLET | Freq: Every day | ORAL | 3 refills | Status: DC

## 2023-05-27 MED ORDER — BUSPIRONE HCL 10 MG PO TABS: 10.0000 mg | ORAL_TABLET | Freq: Every day | ORAL | 1 refills | Status: DC

## 2023-05-27 NOTE — Patient Instructions (Addendum)
It was great seeing you today!  Plan discussed at today's visit: -Can take Buspar in the day as needed for anxiety - cut pill in half to 5 mg if it makes you sleepy. Can continue to take 10 mg at bedtime. Do not take more than 30 mg in a 24 hour period -Can take Ibuprofen only as needed for joint/arthritis pain  Follow up in: 4 months   Take care and let us know if you have any questions or concerns prior to your next visit.  Dr. Caralee Ates

## 2023-06-24 ENCOUNTER — Ambulatory Visit: Payer: Medicare Other | Admitting: Internal Medicine

## 2023-06-25 ENCOUNTER — Ambulatory Visit: Payer: Medicare Other | Admitting: Internal Medicine

## 2023-07-28 ENCOUNTER — Ambulatory Visit (INDEPENDENT_AMBULATORY_CARE_PROVIDER_SITE_OTHER): Payer: 59

## 2023-07-28 DIAGNOSIS — I495 Sick sinus syndrome: Secondary | ICD-10-CM

## 2023-07-29 LAB — CUP PACEART REMOTE DEVICE CHECK
Battery Remaining Longevity: 152 mo
Battery Voltage: 3.08 V
Brady Statistic AP VP Percent: 0.02 %
Brady Statistic AP VS Percent: 97.12 %
Brady Statistic AS VP Percent: 0 %
Brady Statistic AS VS Percent: 2.86 %
Brady Statistic RA Percent Paced: 97.09 %
Brady Statistic RV Percent Paced: 0.02 %
Date Time Interrogation Session: 20241006215223
Implantable Lead Connection Status: 753985
Implantable Lead Connection Status: 753985
Implantable Lead Implant Date: 20121031
Implantable Lead Implant Date: 20121031
Implantable Lead Location: 753859
Implantable Lead Location: 753860
Implantable Lead Model: 5092
Implantable Lead Model: 5592
Implantable Pulse Generator Implant Date: 20230824
Lead Channel Impedance Value: 361 Ohm
Lead Channel Impedance Value: 380 Ohm
Lead Channel Impedance Value: 418 Ohm
Lead Channel Impedance Value: 494 Ohm
Lead Channel Pacing Threshold Amplitude: 0.625 V
Lead Channel Pacing Threshold Pulse Width: 0.4 ms
Lead Channel Sensing Intrinsic Amplitude: 3.125 mV
Lead Channel Sensing Intrinsic Amplitude: 3.125 mV
Lead Channel Sensing Intrinsic Amplitude: 6.875 mV
Lead Channel Sensing Intrinsic Amplitude: 6.875 mV
Lead Channel Setting Pacing Amplitude: 1.5 V
Lead Channel Setting Pacing Amplitude: 2.5 V
Lead Channel Setting Pacing Pulse Width: 0.4 ms
Lead Channel Setting Sensing Sensitivity: 0.9 mV
Zone Setting Status: 755011
Zone Setting Status: 755011

## 2023-08-12 DIAGNOSIS — Z23 Encounter for immunization: Secondary | ICD-10-CM | POA: Diagnosis not present

## 2023-08-13 NOTE — Progress Notes (Signed)
Remote pacemaker transmission.   

## 2023-09-09 NOTE — Progress Notes (Unsigned)
Established Patient Office Visit  Subjective    Patient ID: Jessica Browning, female    DOB: [birth year]  Age: 89 y.o. MRN: [id number]  CC:  No chief complaint on file.   HPI Jessica Browning presents to follow up. She is here with her daughter. Was seen here on 05/20/23 complaining of dizziness and "feeling drunk". CT head showed aging brain without acute insult, labs normal. Referral to Neurology placed but was canceled by the patient's daughter because her symptoms has resolved.   Hypertension/Sinus Sick Syndrome: -Medications: Amlodipine 5 mg -Patient is compliant with above medications and reports no side effects. -Checking BP at home (average): doesn't check -Denies any SOB, CP, vision changes, LE edema or symptoms of hypotension -Following with Cardiology, last note reviewed from 09/18/22.  Pacemaker placed in August 2023  HLD: -Medications: Lipitor 10 mg, aspirin 81 mg -Patient is compliant with above medications and reports no side effects.  -Last lipid panel: Lipid Panel     Component Value Date/Time   CHOL 146 04/30/2022 1639   TRIG 72 04/30/2022 1639   HDL 62 04/30/2022 1639   CHOLHDL 2.4 04/30/2022 1639   LDLCALC 69 04/30/2022 1639   Anxiety: -Currently on Buspar 5-10 mg TID PRN  -Had been on Xanax 0.25 mg before bed every day for the last 20+ years but she tapered off this successfully      05/27/2023    1:05 PM 05/20/2023   10:41 AM 03/04/2023    1:56 PM 12/20/2022   12:58 PM 09/20/2022   12:59 PM  Depression screen PHQ 2/9  Decreased Interest 0 0 0 0 0  Down, Depressed, Hopeless 0 0 0 1 0  PHQ - 2 Score 0 0 0 1 0  Altered sleeping 0 0 0 0 0  Tired, decreased energy 0 0 0 0 0  Change in appetite 0 0 0 0 0  Feeling bad or failure about yourself  0 0 0 0 0  Trouble concentrating 0 0 0 0 0  Moving slowly or fidgety/restless 0 0 0 0 0  Suicidal thoughts 0 0 0 0 0  PHQ-9 Score 0 0 0 1 0  Difficult doing work/chores Not difficult at all Not difficult at  all Not difficult at all  Not difficult at all   Also has a history of recurrent UTI's, has been on Nitrofurantoin 100 mg at bedtime for years for suppression therapy. Notes reviewed from Urogynecology  reviewed from 05/01/21. No symptoms since being on the suppression therapy.   GERD: -Now on Pepcid 20 mg daily with symptoms well-controlled.  Health maintenance: -Blood work up-to-date   Outpatient Encounter Medications as of 09/11/2023  Medication Sig   amLODipine (NORVASC) 5 MG tablet Take 1 tablet (5 mg total) by mouth daily.   aspirin EC 81 MG tablet Take 81 mg by mouth daily. Swallow whole.   atorvastatin (LIPITOR) 10 MG tablet Take 1 tablet (10 mg total) by mouth daily.   busPIRone (BUSPAR) 10 MG tablet Take 1 tablet (10 mg total) by mouth daily.   Calcium Carb-Cholecalciferol 600-10 MG-MCG TABS Take 2 tablets by mouth daily.   docusate sodium (COLACE) 100 MG capsule Take 100 mg by mouth 2 (two) times daily.   EPINEPHrine 0.3 mg/0.3 mL IJ SOAJ injection Inject 0.3 mg into the muscle as needed for anaphylaxis.   famotidine (PEPCID) 20 MG tablet Take 1 tablet (20 mg total) by mouth daily.   nitrofurantoin (MACRODANTIN) 100 MG capsule Take 1 capsule (  100 mg total) by mouth 2 (two) times daily.   No facility-administered encounter medications on file as of 09/11/2023.    Past Medical History:  Diagnosis Date   Anxiety    Basal cell carcinoma 06/18/2016   Posterior neck. Nodular pattern.   Hyperlipidemia    Hypertension     Past Surgical History:  Procedure Laterality Date   ABDOMINAL HYSTERECTOMY     APPENDECTOMY     BIV PACEMAKER GENERATOR CHANGEOUT N/A 06/13/2022   Procedure: BIV PACEMAKER GENERATOR CHANGEOUT;  Surgeon: Lanier Prude, MD;  Location: [facility];  Service: Cardiovascular;  Laterality: N/A;   BLADDER SURGERY     CATARACT EXTRACTION     VEIN LIGATION AND STRIPPING     bilateral legs    Family History  Problem Relation Age of Onset    Pancreatic disease Mother    COPD Sister    Rheum arthritis Sister    Hypertension Daughter    Hyperlipidemia Daughter    Hyperlipidemia Son    Hypertension Son    Heart attack Son     Social History   Socioeconomic History   Marital status: Widowed    Spouse name: Not on file   Number of children: 6   Years of education: Not on file   Highest education level: Not on file  Occupational History   Occupation: Retired  Tobacco Use   Smoking status: Never   Smokeless tobacco: Never  Vaping Use   Vaping status: Never Used  Substance and Sexual Activity   Alcohol use: Never   Drug use: Never   Sexual activity: Not Currently  Other Topics Concern   Not on file  Social History Narrative   Not on file   Social Determinants of Health   Financial Resource Strain: Low Risk  (07/12/2021)   Overall Financial Resource Strain (CARDIA)    Difficulty of Paying Living Expenses: Not very hard  Food Insecurity: No Food Insecurity (09/17/2022)   Hunger Vital Sign    Worried About Running Out of Food in the Last Year: Never true    Ran Out of Food in the Last Year: Never true  Transportation Needs: No Transportation Needs (09/17/2022)   PRAPARE - Administrator, Civil Service (Medical): No    Lack of Transportation (Non-Medical): No  Physical Activity: Insufficiently Active (07/12/2021)   Exercise Vital Sign    Days of Exercise per Week: 2 days    Minutes of Exercise per Session: 20 min  Stress: Stress Concern Present (09/17/2022)   [employer] of Occupational Health - Occupational Stress Questionnaire    Feeling of Stress : To some extent  Social Connections: Moderately Integrated (09/17/2022)   Social Connection and Isolation Panel [NHANES]    Frequency of Communication with Friends and Family: More than three times a week    Frequency of Social Gatherings with Friends and Family: Once a week    Attends Religious Services: More than 4 times per year    Active  Member of [employer] or Organizations: Yes    Attends Banker Meetings: More than 4 times per year    Marital Status: Widowed  Intimate Partner Violence: Not At Risk (09/17/2022)   Humiliation, Afraid, Rape, and Kick questionnaire    Fear of Current or Ex-Partner: No    Emotionally Abused: No    Physically Abused: No    Sexually Abused: No    Review of Systems  Constitutional:  Negative for chills and  fever.  Eyes:  Negative for blurred vision.  Respiratory:  Negative for shortness of breath.   Cardiovascular:  Negative for chest pain.  Gastrointestinal:  Negative for abdominal pain, heartburn, nausea and vomiting.  Neurological:  Negative for dizziness, weakness and headaches.  Psychiatric/Behavioral:  The patient is nervous/anxious. The patient does not have insomnia.        Objective    There were no vitals taken for this visit.  Physical Exam Constitutional:      Appearance: Normal appearance.  HENT:     Head: Normocephalic and atraumatic.  Eyes:     Conjunctiva/sclera: Conjunctivae normal.  Cardiovascular:     Rate and Rhythm: Normal rate and regular rhythm.  Pulmonary:     Effort: Pulmonary effort is normal.     Breath sounds: Normal breath sounds.  Musculoskeletal:     Right lower leg: No edema.     Left lower leg: No edema.  Skin:    General: Skin is warm and dry.  Neurological:     General: No focal deficit present.     Mental Status: She is alert. Mental status is at baseline.     Coordination: Coordination normal.     Gait: Gait normal.  Psychiatric:        Mood and Affect: Mood normal.        Behavior: Behavior normal.       Assessment & Plan:   1. Anxiety: Patient doing well taking BuSpar 10 mg at night but is having some breakthrough anxiety during the day.  Discussed it is fine for her to take 5 to 10 mg as needed during the day for anxiety.  Will refill medication.  - busPIRone (BUSPAR) 10 MG tablet; Take 1 tablet (10 mg total) by  mouth daily.  Dispense: 90 tablet; Refill: 1  2. Essential hypertension: Stable, continue amlodipine 5 mg, refilled.  - amLODipine (NORVASC) 5 MG tablet; Take 1 tablet (5 mg total) by mouth daily.  Dispense: 90 tablet; Refill: 3  3. Gastroesophageal reflux disease, unspecified whether esophagitis present: Symptoms well-controlled, continue Pepcid 20 mg daily, refilled.  - famotidine (PEPCID) 20 MG tablet; Take 1 tablet (20 mg total) by mouth daily.  Dispense: 90 tablet; Refill: 1  4. Recurrent UTI: On antibiotic maintenance therapy to prevent further UTIs per recommendations from her urogynecologist in 2022, will continue.  - nitrofurantoin (MACRODANTIN) 100 MG capsule; Take 1 capsule (100 mg total) by mouth 2 (two) times daily.  Dispense: 60 capsule; Refill: 1  5. Dizziness: Symptoms resolved.  Reviewed CT scan of the head and recent labs.  She does not have a appointment with neurology, for now hold off unless symptoms return.   No follow-ups on file.   Margarita Mail, DO

## 2023-09-11 ENCOUNTER — Encounter: Payer: Self-pay | Admitting: Internal Medicine

## 2023-09-11 ENCOUNTER — Ambulatory Visit (INDEPENDENT_AMBULATORY_CARE_PROVIDER_SITE_OTHER): Payer: Medicare Other | Admitting: Internal Medicine

## 2023-09-11 VITALS — BP 122/68 | HR 86 | Temp 98.1°F | Resp 16 | Ht 62.0 in | Wt 144.1 lb

## 2023-09-11 DIAGNOSIS — N390 Urinary tract infection, site not specified: Secondary | ICD-10-CM | POA: Diagnosis not present

## 2023-09-11 DIAGNOSIS — I10 Essential (primary) hypertension: Secondary | ICD-10-CM

## 2023-09-11 DIAGNOSIS — E782 Mixed hyperlipidemia: Secondary | ICD-10-CM | POA: Diagnosis not present

## 2023-09-11 DIAGNOSIS — K219 Gastro-esophageal reflux disease without esophagitis: Secondary | ICD-10-CM

## 2023-09-11 DIAGNOSIS — F419 Anxiety disorder, unspecified: Secondary | ICD-10-CM | POA: Diagnosis not present

## 2023-09-11 MED ORDER — FAMOTIDINE 20 MG PO TABS: 20.0000 mg | ORAL_TABLET | Freq: Two times a day (BID) | ORAL | 1 refills | Status: DC

## 2023-09-11 MED ORDER — BUSPIRONE HCL 10 MG PO TABS: 10.0000 mg | ORAL_TABLET | Freq: Two times a day (BID) | ORAL | 1 refills | Status: DC

## 2023-09-11 MED ORDER — NITROFURANTOIN MACROCRYSTAL 100 MG PO CAPS
100.0000 mg | ORAL_CAPSULE | Freq: Two times a day (BID) | ORAL | 1 refills | Status: DC
Start: 2023-09-11 — End: 2023-12-16

## 2023-09-11 NOTE — Patient Instructions (Signed)
It was great seeing you today!  Plan discussed at today's visit: -Increase Pepcid to twice a day for acid reflux -Increase Buspar twice a day for anxiety  Follow up in: 3 months   Take care and let us know if you have any questions or concerns prior to your next visit.  Dr. Caralee Ates

## 2023-09-26 DIAGNOSIS — J3089 Other allergic rhinitis: Secondary | ICD-10-CM | POA: Diagnosis not present

## 2023-09-26 DIAGNOSIS — Z91018 Allergy to other foods: Secondary | ICD-10-CM | POA: Diagnosis not present

## 2023-09-26 DIAGNOSIS — J3081 Allergic rhinitis due to animal (cat) (dog) hair and dander: Secondary | ICD-10-CM | POA: Diagnosis not present

## 2023-10-16 DIAGNOSIS — M2012 Hallux valgus (acquired), left foot: Secondary | ICD-10-CM | POA: Diagnosis not present

## 2023-10-16 DIAGNOSIS — L600 Ingrowing nail: Secondary | ICD-10-CM | POA: Diagnosis not present

## 2023-10-16 DIAGNOSIS — M2042 Other hammer toe(s) (acquired), left foot: Secondary | ICD-10-CM | POA: Diagnosis not present

## 2023-10-16 DIAGNOSIS — B351 Tinea unguium: Secondary | ICD-10-CM | POA: Diagnosis not present

## 2023-10-16 DIAGNOSIS — M79675 Pain in left toe(s): Secondary | ICD-10-CM | POA: Diagnosis not present

## 2023-10-23 ENCOUNTER — Ambulatory Visit: Payer: Medicare Other

## 2023-10-23 VITALS — BP 138/80 | Ht 62.0 in | Wt 146.3 lb

## 2023-10-23 DIAGNOSIS — Z0000 Encounter for general adult medical examination without abnormal findings: Secondary | ICD-10-CM | POA: Diagnosis not present

## 2023-10-23 NOTE — Progress Notes (Signed)
 Subjective:   Jessica Browning is a 89 y.o. female who presents for Medicare Annual (Subsequent) preventive examination.  Visit Complete: In person  Cardiac Risk Factors include: advanced age (>28men, >21 women);dyslipidemia;sedentary lifestyle     Objective:    Today's Vitals   10/23/23 1417  BP: 138/80  Weight: 146 lb 4.8 oz (66.4 kg)  Height: 5' 2 (1.575 m)   Body mass index is 26.76 kg/m.     10/23/2023    2:29 PM 06/13/2022   10:42 AM 07/12/2021   12:33 PM  Advanced Directives  Does Patient Have a Medical Advance Directive? No Yes Yes  Type of Advance Directive  Healthcare Power of Attorney Living will  Does patient want to make changes to medical advance directive?  No - Patient declined   Would patient like information on creating a medical advance directive? No - Patient declined      Current Medications (verified) Outpatient Encounter Medications as of 10/23/2023  Medication Sig   amLODipine  (NORVASC ) 5 MG tablet Take 1 tablet (5 mg total) by mouth daily.   aspirin EC 81 MG tablet Take 81 mg by mouth daily. Swallow whole.   atorvastatin  (LIPITOR) 10 MG tablet Take 1 tablet (10 mg total) by mouth daily.   busPIRone  (BUSPAR ) 10 MG tablet Take 1 tablet (10 mg total) by mouth 2 (two) times daily.   Calcium  Carb-Cholecalciferol  600-10 MG-MCG TABS Take 2 tablets by mouth daily.   docusate sodium (COLACE) 100 MG capsule Take 100 mg by mouth 2 (two) times daily.   EPINEPHrine  0.3 mg/0.3 mL IJ SOAJ injection Inject 0.3 mg into the muscle as needed for anaphylaxis.   famotidine  (PEPCID ) 20 MG tablet Take 1 tablet (20 mg total) by mouth 2 (two) times daily.   nitrofurantoin  (MACRODANTIN ) 100 MG capsule Take 1 capsule (100 mg total) by mouth 2 (two) times daily.   No facility-administered encounter medications on file as of 10/23/2023.    Allergies (verified) Doxycycline and Tetanus toxoids   History: Past Medical History:  Diagnosis Date   Anxiety    Basal cell  carcinoma 06/18/2016   Posterior neck. Nodular pattern.   Hyperlipidemia    Hypertension    Past Surgical History:  Procedure Laterality Date   ABDOMINAL HYSTERECTOMY     APPENDECTOMY     BIV PACEMAKER GENERATOR CHANGEOUT N/A 06/13/2022   Procedure: BIV PACEMAKER GENERATOR CHANGEOUT;  Surgeon: Cindie Ole DASEN, MD;  Location: [facility];  Service: Cardiovascular;  Laterality: N/A;   BLADDER SURGERY     CATARACT EXTRACTION     VEIN LIGATION AND STRIPPING     bilateral legs   Family History  Problem Relation Age of Onset   Pancreatic disease Mother    COPD Sister    Rheum arthritis Sister    Hypertension Daughter    Hyperlipidemia Daughter    Hyperlipidemia Son    Hypertension Son    Heart attack Son    Social History   Socioeconomic History   Marital status: Widowed    Spouse name: Not on file   Number of children: 6   Years of education: Not on file   Highest education level: Not on file  Occupational History   Occupation: Retired  Tobacco Use   Smoking status: Never   Smokeless tobacco: Never  Vaping Use   Vaping status: Never Used  Substance and Sexual Activity   Alcohol use: Never   Drug use: Never   Sexual activity: Not Currently  Other Topics Concern   Not on file  Social History Narrative   Not on file   Social Drivers of Health   Financial Resource Strain: Low Risk  (10/23/2023)   Overall Financial Resource Strain (CARDIA)    Difficulty of Paying Living Expenses: Not hard at all  Food Insecurity: No Food Insecurity (10/23/2023)   Hunger Vital Sign    Worried About Running Out of Food in the Last Year: Never true    Ran Out of Food in the Last Year: Never true  Transportation Needs: No Transportation Needs (10/23/2023)   PRAPARE - Administrator, Civil Service (Medical): No    Lack of Transportation (Non-Medical): No  Physical Activity: Insufficiently Active (10/23/2023)   Exercise Vital Sign    Days of Exercise per Week: 2 days     Minutes of Exercise per Session: 20 min  Stress: No Stress Concern Present (10/23/2023)   [employer] of Occupational Health - Occupational Stress Questionnaire    Feeling of Stress : Not at all  Social Connections: Moderately Integrated (10/23/2023)   Social Connection and Isolation Panel [NHANES]    Frequency of Communication with Friends and Family: More than three times a week    Frequency of Social Gatherings with Friends and Family: Once a week    Attends Religious Services: More than 4 times per year    Active Member of [employer] or Organizations: Yes    Attends Banker Meetings: More than 4 times per year    Marital Status: Widowed    Tobacco Counseling Counseling given: Not Answered   Clinical Intake:  Pre-visit preparation completed: Yes  Pain : No/denies pain     BMI - recorded: 26.76 Nutritional Status: BMI 25 -29 Overweight Nutritional Risks: None Diabetes: No  How often do you need to have someone help you when you read instructions, pamphlets, or other written materials from your doctor or pharmacy?: 1 - Never  Interpreter Needed?: No  Information entered by :: JHONNIE DAS, LPN   Activities of Daily Living    10/23/2023    2:32 PM 09/11/2023    1:01 PM  In your present state of health, do you have any difficulty performing the following activities:  Hearing? 1 1  Vision? 0 0  Difficulty concentrating or making decisions? 0 1  Walking or climbing stairs? 1 0  Comment SHOB; KNEE PAIN   Dressing or bathing? 0 0  Doing errands, shopping? 1 1  Preparing Food and eating ? N   Using the Toilet? N   In the past six months, have you accidently leaked urine? N   Do you have problems with loss of bowel control? N   Managing your Medications? N   Managing your Finances? N   Housekeeping or managing your Housekeeping? N     Patient Care Team: Bernardo Fend, DO as PCP - General (Internal Medicine) Cindie Ole DASEN, MD as PCP -  Electrophysiology (Cardiology) [facility], [facility]  Indicate any recent Medical Services you may have received from other than [facility] providers in the past year (date may be approximate).     Assessment:   This is a routine wellness examination for Jessica Browning.  Hearing/Vision screen Hearing Screening - Comments:: WEARS AIDS, BOTH EARS Vision Screening - Comments:: READERS, HAD CATARACT SGY- [facility]   Goals Addressed             This Visit's Progress    DIET - EAT MORE FRUITS  AND VEGETABLES         Depression Screen    10/23/2023    2:25 PM 09/11/2023    1:01 PM 05/27/2023    1:05 PM 05/20/2023   10:41 AM 03/04/2023    1:56 PM 12/20/2022   12:58 PM 09/20/2022   12:59 PM  PHQ 2/9 Scores  PHQ - 2 Score 2 0 0 0 0 1 0  PHQ- 9 Score 3 0 0 0 0 1 0    Fall Risk    10/23/2023    2:32 PM 09/11/2023    1:01 PM 05/27/2023    1:05 PM 05/20/2023   10:41 AM 03/04/2023    1:50 PM  Fall Risk   Falls in the past year? 1 1 1  0 1  Number falls in past yr: 0 0 0 0 0  Injury with Fall? 1 1 1  0 0  Risk for fall due to : History of fall(s)      Follow up Falls evaluation completed;Falls prevention discussed        MEDICARE RISK AT HOME: Medicare Risk at Home Any stairs in or around the home?: Yes If so, are there any without handrails?: No Home free of loose throw rugs in walkways, pet beds, electrical cords, etc?: Yes Adequate lighting in your home to reduce risk of falls?: Yes Life alert?: No Use of a cane, walker or w/c?: No Grab bars in the bathroom?: Yes Shower chair or bench in shower?: Yes Elevated toilet seat or a handicapped toilet?: Yes  TIMED UP AND GO:  Was the test performed?  Yes  Length of time to ambulate 10 feet: 4 sec Gait steady and fast without use of assistive device    Cognitive Function:    12/20/2022    1:34 PM 07/12/2021   12:37 PM  MMSE - Mini Mental State Exam  Orientation to time 5 5  Orientation to Place 5 5  Registration 3 3  Attention/  Calculation 3 5  Recall 3 3  Language- name 2 objects 2 2  Language- repeat 1 1  Language- follow 3 step command 3 3  Language- read & follow direction 1 1  Write a sentence 1 0  Copy design 1 0  Total score 28 28        10/23/2023    2:35 PM 09/17/2022    1:43 PM 07/12/2021   12:37 PM  6CIT Screen  What Year? 0 points 0 points 0 points  What month? 0 points 0 points 0 points  What time? 0 points 0 points 0 points  Count back from 20 0 points 0 points 0 points  Months in reverse 0 points 0 points 0 points  Repeat phrase 0 points 10 points 0 points  Total Score 0 points 10 points 0 points    Immunizations Immunization History  Administered Date(s) Administered   Fluad Quad(high Dose 65+) 06/29/2020, 08/19/2022   Influenza, High Dose Seasonal PF 08/01/2015, 07/23/2016, 07/21/2018, 08/10/2021   Influenza-Unspecified 06/22/2015, 06/21/2018, 08/12/2023   Moderna Sars-Covid-2 Vaccination 11/03/2019, 12/01/2019, 07/18/2020, 08/21/2020   PNEUMOCOCCAL CONJUGATE-20 09/20/2022   Pneumococcal Polysaccharide-23 07/21/2018, 07/24/2019   Tdap 12/28/2009   Zoster Recombinant(Shingrix) 12/01/2012    TDAP status: Due, Education has been provided regarding the importance of this vaccine. Advised may receive this vaccine at local pharmacy or Health Dept. Aware to provide a copy of the vaccination record if obtained from local pharmacy or Health Dept. Verbalized acceptance and understanding.  Flu Vaccine status: Up  to date  Pneumococcal vaccine status: Up to date  Covid-19 vaccine status: Completed vaccines  Qualifies for Shingles Vaccine? Yes   Zostavax completed No   Shingrix Completed?: No.    Education has been provided regarding the importance of this vaccine. Patient has been advised to call insurance company to determine out of pocket expense if they have not yet received this vaccine. Advised may also receive vaccine at local pharmacy or Health Dept. Verbalized acceptance and  understanding.  Screening Tests Health Maintenance  Topic Date Due   DEXA SCAN  Never done   COVID-19 Vaccine (5 - 2024-25 season) 06/22/2023   Zoster Vaccines- Shingrix (2 of 2) 12/12/2023 (Originally 01/26/2013)   Medicare Annual Wellness (AWV)  10/22/2024   Pneumonia Vaccine 22+ Years old  Completed   INFLUENZA VACCINE  Completed   HPV VACCINES  Aged Out   DTaP/Tdap/Td  Discontinued  HAD ONE SHINGRIX SHOT- SWELLING FROM THAT, NEVER HAD ANOTHER ONE  Health Maintenance  Health Maintenance Due  Topic Date Due   DEXA SCAN  Never done   COVID-19 Vaccine (5 - 2024-25 season) 06/22/2023    Colorectal cancer screening: No longer required.   Mammogram status: No longer required due to AGE.   Lung Cancer Screening: (Low Dose CT Chest recommended if Age 33-80 years, 20 pack-year currently smoking OR have quit w/in 15years.) does not qualify.    Additional Screening:  Hepatitis C Screening: does not qualify; Completed NO  Vision Screening: Recommended annual ophthalmology exams for early detection of glaucoma and other disorders of the eye. Is the patient up to date with their annual eye exam?  Yes  Who is the provider or what is the name of the office in which the patient attends annual eye exams? [facility] If pt is not established with a provider, would they like to be referred to a provider to establish care? No .   Dental Screening: Recommended annual dental exams for proper oral hygiene   Community Resource Referral / Chronic Care Management: CRR required this visit?  No   CCM required this visit?  No     Plan:     I have personally reviewed and noted the following in the patient's chart:   Medical and social history Use of alcohol, tobacco or illicit drugs  Current medications and supplements including opioid prescriptions. Patient is not currently taking opioid prescriptions. Functional ability and status Nutritional status Physical activity Advanced  directives List of other physicians Hospitalizations, surgeries, and ER visits in previous 12 months Vitals Screenings to include cognitive, depression, and falls Referrals and appointments  In addition, I have reviewed and discussed with patient certain preventive protocols, quality metrics, and best practice recommendations. A written personalized care plan for preventive services as well as general preventive health recommendations were provided to patient.     Jhonnie GORMAN Das, LPN   05/27/7973   After Visit Summary: (In Person-Declined) Patient declined AVS at this time.  Nurse Notes: NONE

## 2023-10-23 NOTE — Patient Instructions (Addendum)
 Jessica Browning , Thank you for taking time to come for your Medicare Wellness Visit. I appreciate your ongoing commitment to your health goals. Please review the following plan we discussed and let me know if I can assist you in the future.   Referrals/Orders/Follow-Ups/Clinician Recommendations: NONE  This is a list of the screening recommended for you and due dates:  Health Maintenance  Topic Date Due   DEXA scan (bone density measurement)  Never done   COVID-19 Vaccine (5 - 2024-25 season) 06/22/2023   Zoster (Shingles) Vaccine (2 of 2) 12/12/2023*   Medicare Annual Wellness Visit  10/22/2024   Pneumonia Vaccine  Completed   Flu Shot  Completed   HPV Vaccine  Aged Out   DTaP/Tdap/Td vaccine  Discontinued  *Topic was postponed. The date shown is not the original due date.    Advanced directives: (ACP Link)Information on Advanced Care Planning can be found at [place]  Secretary of [facility] Advance Health Care Directives Advance Health Care Directives ([email or web address]   Next Medicare Annual Wellness Visit scheduled for next year: Yes    11/04/24 @ 2:30 PM IN PERSON

## 2023-10-28 ENCOUNTER — Ambulatory Visit (INDEPENDENT_AMBULATORY_CARE_PROVIDER_SITE_OTHER): Payer: 59

## 2023-10-28 DIAGNOSIS — I495 Sick sinus syndrome: Secondary | ICD-10-CM

## 2023-10-29 LAB — CUP PACEART REMOTE DEVICE CHECK
Battery Remaining Longevity: 148 mo
Battery Voltage: 3.06 V
Brady Statistic AP VP Percent: 0.02 %
Brady Statistic AP VS Percent: 95.42 %
Brady Statistic AS VP Percent: 0 %
Brady Statistic AS VS Percent: 4.56 %
Brady Statistic RA Percent Paced: 95.35 %
Brady Statistic RV Percent Paced: 0.02 %
Date Time Interrogation Session: 20250106205549
Implantable Lead Connection Status: 753985
Implantable Lead Connection Status: 753985
Implantable Lead Implant Date: 20121031
Implantable Lead Implant Date: 20121031
Implantable Lead Location: 753859
Implantable Lead Location: 753860
Implantable Lead Model: 5092
Implantable Lead Model: 5592
Implantable Pulse Generator Implant Date: 20230824
Lead Channel Impedance Value: 342 Ohm
Lead Channel Impedance Value: 380 Ohm
Lead Channel Impedance Value: 418 Ohm
Lead Channel Impedance Value: 475 Ohm
Lead Channel Pacing Threshold Amplitude: 0.625 V
Lead Channel Pacing Threshold Pulse Width: 0.4 ms
Lead Channel Sensing Intrinsic Amplitude: 2.75 mV
Lead Channel Sensing Intrinsic Amplitude: 2.75 mV
Lead Channel Sensing Intrinsic Amplitude: 6.5 mV
Lead Channel Sensing Intrinsic Amplitude: 6.5 mV
Lead Channel Setting Pacing Amplitude: 1.5 V
Lead Channel Setting Pacing Amplitude: 2.5 V
Lead Channel Setting Pacing Pulse Width: 0.4 ms
Lead Channel Setting Sensing Sensitivity: 0.9 mV
Zone Setting Status: 755011
Zone Setting Status: 755011

## 2023-11-16 ENCOUNTER — Other Ambulatory Visit: Payer: Self-pay | Admitting: Internal Medicine

## 2023-11-16 DIAGNOSIS — K219 Gastro-esophageal reflux disease without esophagitis: Secondary | ICD-10-CM

## 2023-11-18 NOTE — Telephone Encounter (Signed)
Requested medications are due for refill today.  Depends on which sig is correct  Requested medications are on the active medications list.  yes  Last refill. 09/11/2023 #90  1rf  Future visit scheduled.   yes  Notes to clinic.  Sig on rx request is different from one on med list.    Requested Prescriptions  Pending Prescriptions Disp Refills   famotidine (PEPCID) 20 MG tablet [Pharmacy Med Name: FAMOTIDINE 20 MG TABLET] 90 tablet 1    Sig: TAKE 1 TABLET BY MOUTH EVERY DAY     Gastroenterology:  H2 Antagonists Passed - 11/18/2023  3:06 PM      Passed - Valid encounter within last 12 months    Recent Outpatient Visits           2 months ago Gastroesophageal reflux disease, unspecified whether esophagitis present   [facility] Margarita Mail, DO   5 months ago Anxiety   [facility] Margarita Mail, DO   6 months ago Dizziness   [facility] Berniece Salines, FNP   8 months ago Swelling of left knee joint   [facility] Margarita Mail, DO   11 months ago Gastroesophageal reflux disease, unspecified whether esophagitis present   [facility] Margarita Mail, DO       Future Appointments             In 4 weeks Margarita Mail, DO [facility], [facility]

## 2023-11-26 ENCOUNTER — Other Ambulatory Visit: Payer: Self-pay | Admitting: Internal Medicine

## 2023-11-26 DIAGNOSIS — F419 Anxiety disorder, unspecified: Secondary | ICD-10-CM

## 2023-11-27 NOTE — Telephone Encounter (Signed)
 Requested medications are due for refill today.  unsure  Requested medications are on the active medications list.  yes  Last refill. 09/11/2023 #90 1 rf  Future visit scheduled.   yes  Notes to clinic.  Sig on med list is different from sig on request.    Requested Prescriptions  Pending Prescriptions Disp Refills   busPIRone  (BUSPAR ) 10 MG tablet [Pharmacy Med Name: BUSPIRONE  HCL 10 MG TABLET] 90 tablet 1    Sig: TAKE 1 TABLET BY MOUTH EVERY DAY     Psychiatry: Anxiolytics/Hypnotics - Non-controlled Passed - 11/27/2023 12:26 PM      Passed - Valid encounter within last 12 months    Recent Outpatient Visits           2 months ago Gastroesophageal reflux disease, unspecified whether esophagitis present   [facility] Bernardo Fend, DO   6 months ago Anxiety   [facility] Bernardo Fend, DO   6 months ago Dizziness   [facility] Gareth Mliss FALCON, FNP   8 months ago Swelling of left knee joint   [facility] Bernardo Fend, DO   11 months ago Gastroesophageal reflux disease, unspecified whether esophagitis present   [facility] Bernardo Fend, DO       Future Appointments             In 2 weeks Bernardo Fend, DO [facility], [facility]

## 2023-12-04 DIAGNOSIS — H524 Presbyopia: Secondary | ICD-10-CM | POA: Diagnosis not present

## 2023-12-04 DIAGNOSIS — Z961 Presence of intraocular lens: Secondary | ICD-10-CM | POA: Diagnosis not present

## 2023-12-10 NOTE — Progress Notes (Signed)
 Remote pacemaker transmission.

## 2023-12-10 NOTE — Addendum Note (Signed)
 Addended by: Geralyn Flash D on: 12/10/2023 12:06 PM   Modules accepted: Orders

## 2023-12-15 ENCOUNTER — Ambulatory Visit: Payer: Medicare Other | Admitting: Internal Medicine

## 2023-12-16 ENCOUNTER — Other Ambulatory Visit: Payer: Self-pay

## 2023-12-16 ENCOUNTER — Encounter: Payer: Self-pay | Admitting: Internal Medicine

## 2023-12-16 ENCOUNTER — Ambulatory Visit (INDEPENDENT_AMBULATORY_CARE_PROVIDER_SITE_OTHER): Payer: Medicare Other | Admitting: Internal Medicine

## 2023-12-16 VITALS — BP 128/76 | HR 87 | Temp 98.3°F | Resp 16 | Ht 62.0 in | Wt 144.2 lb

## 2023-12-16 DIAGNOSIS — F419 Anxiety disorder, unspecified: Secondary | ICD-10-CM | POA: Diagnosis not present

## 2023-12-16 DIAGNOSIS — R600 Localized edema: Secondary | ICD-10-CM

## 2023-12-16 DIAGNOSIS — I495 Sick sinus syndrome: Secondary | ICD-10-CM

## 2023-12-16 DIAGNOSIS — N390 Urinary tract infection, site not specified: Secondary | ICD-10-CM | POA: Insufficient documentation

## 2023-12-16 DIAGNOSIS — K219 Gastro-esophageal reflux disease without esophagitis: Secondary | ICD-10-CM | POA: Diagnosis not present

## 2023-12-16 DIAGNOSIS — E782 Mixed hyperlipidemia: Secondary | ICD-10-CM | POA: Diagnosis not present

## 2023-12-16 DIAGNOSIS — I10 Essential (primary) hypertension: Secondary | ICD-10-CM | POA: Diagnosis not present

## 2023-12-16 DIAGNOSIS — Z95811 Presence of heart assist device: Secondary | ICD-10-CM | POA: Diagnosis not present

## 2023-12-16 MED ORDER — NITROFURANTOIN MACROCRYSTAL 100 MG PO CAPS: 100.0000 mg | ORAL_CAPSULE | Freq: Every evening | ORAL | 2 refills | Status: DC

## 2023-12-16 NOTE — Assessment & Plan Note (Signed)
 Stable, taking BuSpar 10 mg at night, discussed again that she can take it up to 3 times a day as needed for anxiety.

## 2023-12-16 NOTE — Assessment & Plan Note (Signed)
 Blood pressure well-controlled and no changes made to medications.

## 2023-12-16 NOTE — Assessment & Plan Note (Signed)
 Currently on statin and aspirin, plan to recheck fasting labs at follow-up.

## 2023-12-16 NOTE — Assessment & Plan Note (Signed)
 Refill Macrobid to take at bedtime to prevent recurrent urinary tract infection.

## 2023-12-16 NOTE — Assessment & Plan Note (Signed)
 Symptoms stable on Pepcid 20 mg daily.

## 2023-12-16 NOTE — Progress Notes (Signed)
 Established Patient Office Visit  Subjective    Patient ID: Jessica Browning, female    DOB: 19-Dec-1933  Age: 89 y.o. MRN: [id number]  CC:  Chief Complaint  Patient presents with   Medical Management of Chronic Issues    3 month follow up    HPI Jessica Browning presents to follow up. She is here with her daughter.   Hypertension/Sinus Sick Syndrome: -Medications: Amlodipine 5 mg -Patient is compliant with above medications and reports no side effects. -Checking BP at home (average): doesn't check -Denies any SOB, CP, vision changes or symptoms of hypotension.  Has noticed gravity dependent edema, patient states it is worse in her right lower extremity.  This only occurs after she is doing work in the yard and will resolve with elevating her legs. -Following with Cardiology -Pacemaker placed in August 2023  HLD: -Medications: Lipitor 10 mg, aspirin 81 mg -Patient is compliant with above medications and reports no side effects.  -Last lipid panel: Lipid Panel     Component Value Date/Time   CHOL 146 04/30/2022 1639   TRIG 72 04/30/2022 1639   HDL 62 04/30/2022 1639   CHOLHDL 2.4 04/30/2022 1639   LDLCALC 69 04/30/2022 1639   Anxiety: -Currently on Buspar 10 mg once daily, taking at night although we did discuss increasing dose to TID PRN -Had been on Xanax 0.25 mg before bed every day for the last 20+ years but she tapered off this successfully      10/23/2023    2:25 PM 09/11/2023    1:01 PM 05/27/2023    1:05 PM 05/20/2023   10:41 AM 03/04/2023    1:56 PM  Depression screen PHQ 2/9  Decreased Interest 1 0 0 0 0  Down, Depressed, Hopeless 1 0 0 0 0  PHQ - 2 Score 2 0 0 0 0  Altered sleeping 0 0 0 0 0  Tired, decreased energy 1 0 0 0 0  Change in appetite 0 0 0 0 0  Feeling bad or failure about yourself  0 0 0 0 0  Trouble concentrating 0 0 0 0 0  Moving slowly or fidgety/restless 0 0 0 0 0  Suicidal thoughts 0 0 0 0 0  PHQ-9 Score 3 0 0 0 0  Difficult doing  work/chores Not difficult at all Not difficult at all Not difficult at all Not difficult at all Not difficult at all   Also has a history of recurrent UTI's, has been on Nitrofurantoin 100 mg at bedtime for years for suppression therapy. Notes reviewed from Urogynecology  reviewed from 05/01/21. No symptoms since being on the suppression therapy.   GERD: -Now on Pepcid 20 mg daily, symptoms well controlled   Health maintenance: -Blood work up-to-date   Outpatient Encounter Medications as of 12/16/2023  Medication Sig   amLODipine (NORVASC) 5 MG tablet Take 1 tablet (5 mg total) by mouth daily.   aspirin EC 81 MG tablet Take 81 mg by mouth daily. Swallow whole.   atorvastatin (LIPITOR) 10 MG tablet Take 1 tablet (10 mg total) by mouth daily.   busPIRone (BUSPAR) 10 MG tablet Take 1 tablet (10 mg total) by mouth 2 (two) times daily.   Calcium Carb-Cholecalciferol 600-10 MG-MCG TABS Take 2 tablets by mouth daily.   docusate sodium (COLACE) 100 MG capsule Take 100 mg by mouth 2 (two) times daily.   EPINEPHrine 0.3 mg/0.3 mL IJ SOAJ injection Inject 0.3 mg into the muscle as needed for anaphylaxis.  famotidine (PEPCID) 20 MG tablet TAKE 1 TABLET BY MOUTH EVERY DAY   nitrofurantoin (MACRODANTIN) 100 MG capsule Take 1 capsule (100 mg total) by mouth 2 (two) times daily.   No facility-administered encounter medications on file as of 12/16/2023.    Past Medical History:  Diagnosis Date   Anxiety    Basal cell carcinoma 06/18/2016   Posterior neck. Nodular pattern.   Hyperlipidemia    Hypertension     Past Surgical History:  Procedure Laterality Date   ABDOMINAL HYSTERECTOMY     APPENDECTOMY     BIV PACEMAKER GENERATOR CHANGEOUT N/A 06/13/2022   Procedure: BIV PACEMAKER GENERATOR CHANGEOUT;  Surgeon: Lanier Prude, MD;  Location: [facility];  Service: Cardiovascular;  Laterality: N/A;   BLADDER SURGERY     CATARACT EXTRACTION     VEIN LIGATION AND STRIPPING     bilateral  legs    Family History  Problem Relation Age of Onset   Pancreatic disease Mother    COPD Sister    Rheum arthritis Sister    Hypertension Daughter    Hyperlipidemia Daughter    Hyperlipidemia Son    Hypertension Son    Heart attack Son     Social History   Socioeconomic History   Marital status: Widowed    Spouse name: Not on file   Number of children: 6   Years of education: Not on file   Highest education level: Not on file  Occupational History   Occupation: Retired  Tobacco Use   Smoking status: Never   Smokeless tobacco: Never  Vaping Use   Vaping status: Never Used  Substance and Sexual Activity   Alcohol use: Never   Drug use: Never   Sexual activity: Not Currently  Other Topics Concern   Not on file  Social History Narrative   Not on file   Social Drivers of Health   Financial Resource Strain: Low Risk  (10/23/2023)   Overall Financial Resource Strain (CARDIA)    Difficulty of Paying Living Expenses: Not hard at all  Food Insecurity: No Food Insecurity (10/23/2023)   Hunger Vital Sign    Worried About Running Out of Food in the Last Year: Never true    Ran Out of Food in the Last Year: Never true  Transportation Needs: No Transportation Needs (10/23/2023)   PRAPARE - Administrator, Civil Service (Medical): No    Lack of Transportation (Non-Medical): No  Physical Activity: Insufficiently Active (10/23/2023)   Exercise Vital Sign    Days of Exercise per Week: 2 days    Minutes of Exercise per Session: 20 min  Stress: No Stress Concern Present (10/23/2023)   [employer] of Occupational Health - Occupational Stress Questionnaire    Feeling of Stress : Not at all  Social Connections: Moderately Integrated (10/23/2023)   Social Connection and Isolation Panel [NHANES]    Frequency of Communication with Friends and Family: More than three times a week    Frequency of Social Gatherings with Friends and Family: Once a week    Attends Religious  Services: More than 4 times per year    Active Member of [employer] or Organizations: Yes    Attends Banker Meetings: More than 4 times per year    Marital Status: Widowed  Intimate Partner Violence: Not At Risk (10/23/2023)   Humiliation, Afraid, Rape, and Kick questionnaire    Fear of Current or Ex-Partner: No    Emotionally Abused: No  Physically Abused: No    Sexually Abused: No    Review of Systems  Constitutional:  Negative for chills and fever.  Eyes:  Negative for blurred vision.  Respiratory:  Negative for shortness of breath.   Cardiovascular:  Positive for leg swelling. Negative for chest pain.  Gastrointestinal:  Negative for abdominal pain, heartburn, nausea and vomiting.  Neurological:  Negative for dizziness, weakness and headaches.  Psychiatric/Behavioral:  The patient does not have insomnia.        Objective    BP 128/76 (Cuff Size: Normal)   Pulse 87   Temp 98.3 F (36.8 C) (Oral)   Resp 16   Ht 5\' 2"  (1.575 m)   Wt 144 lb 3.2 oz (65.4 kg)   SpO2 95%   BMI 26.37 kg/m   Physical Exam Constitutional:      Appearance: Normal appearance.  HENT:     Head: Normocephalic and atraumatic.  Eyes:     Conjunctiva/sclera: Conjunctivae normal.  Cardiovascular:     Rate and Rhythm: Normal rate and regular rhythm.  Pulmonary:     Effort: Pulmonary effort is normal.     Breath sounds: Normal breath sounds.  Musculoskeletal:     Comments: Mild bilateral lower extremity nonpitting edema  Skin:    General: Skin is warm and dry.  Neurological:     General: No focal deficit present.     Mental Status: She is alert. Mental status is at baseline.  Psychiatric:        Mood and Affect: Mood normal.        Behavior: Behavior normal.       Assessment & Plan:   Essential hypertension Assessment & Plan: Blood pressure well-controlled and no changes made to medications.   Mixed hyperlipidemia Assessment & Plan: Currently on statin and aspirin, plan  to recheck fasting labs at follow-up.   Recurrent UTI Assessment & Plan: Refill Macrobid to take at bedtime to prevent recurrent urinary tract infection.  Orders: -     Nitrofurantoin Macrocrystal; Take 1 capsule (100 mg total) by mouth at bedtime.  Dispense: 60 capsule; Refill: 2  Anxiety Assessment & Plan: Stable, taking BuSpar 10 mg at night, discussed again that she can take it up to 3 times a day as needed for anxiety.   Gastroesophageal reflux disease without esophagitis Assessment & Plan: Symptoms stable on Pepcid 20 mg daily.   Lower extremity edema  Presence of heart assist device [facility]) Assessment & Plan: Following with cardiology, pacemaker placed in August 2023.   Sick sinus syndrome [facility]) Assessment & Plan: Stable, following with cardiology.   Regarding lower extremity edema, discussed that it is mild and even on both sides.  Most likely gravity dependent edema, recommend keeping legs elevated and wearing compression stockings, especially if she knows she is going to be more active like working in her backyard.  Will continue to monitor, if symptoms continue may need to discontinue amlodipine.   Return in about 6 months (around 06/14/2024).   Margarita Mail, DO

## 2023-12-16 NOTE — Assessment & Plan Note (Signed)
 Following with cardiology, pacemaker placed in August 2023.

## 2023-12-16 NOTE — Assessment & Plan Note (Signed)
 Stable, following with cardiology

## 2024-01-02 ENCOUNTER — Other Ambulatory Visit: Payer: Self-pay | Admitting: Internal Medicine

## 2024-01-02 DIAGNOSIS — F419 Anxiety disorder, unspecified: Secondary | ICD-10-CM

## 2024-01-02 NOTE — Telephone Encounter (Signed)
 Requested Prescriptions  Pending Prescriptions Disp Refills   busPIRone (BUSPAR) 10 MG tablet [Pharmacy Med Name: BUSPIRONE HCL 10 MG TABLET] 180 tablet 1    Sig: TAKE 1 TABLET BY MOUTH TWICE A DAY     Psychiatry: Anxiolytics/Hypnotics - Non-controlled Passed - 01/02/2024  3:50 PM      Passed - Valid encounter within last 12 months    Recent Outpatient Visits           3 months ago Gastroesophageal reflux disease, unspecified whether esophagitis present   [facility] Margarita Mail, DO   7 months ago Anxiety   [facility] Margarita Mail, DO   7 months ago Dizziness   [facility] Berniece Salines, FNP   10 months ago Swelling of left knee joint   [facility] Margarita Mail, DO   1 year ago Gastroesophageal reflux disease, unspecified whether esophagitis present   [facility] Margarita Mail, DO       Future Appointments             In 5 months Margarita Mail, DO [facility], [facility]

## 2024-01-28 ENCOUNTER — Ambulatory Visit (INDEPENDENT_AMBULATORY_CARE_PROVIDER_SITE_OTHER): Payer: 59

## 2024-01-28 DIAGNOSIS — I495 Sick sinus syndrome: Secondary | ICD-10-CM

## 2024-01-28 LAB — CUP PACEART REMOTE DEVICE CHECK
Battery Remaining Longevity: 144 mo
Battery Voltage: 3.04 V
Brady Statistic AP VP Percent: 0.01 %
Brady Statistic AP VS Percent: 95.97 %
Brady Statistic AS VP Percent: 0 %
Brady Statistic AS VS Percent: 4.02 %
Brady Statistic RA Percent Paced: 95.95 %
Brady Statistic RV Percent Paced: 0.01 %
Date Time Interrogation Session: 20250409072107
Implantable Lead Connection Status: 753985
Implantable Lead Connection Status: 753985
Implantable Lead Implant Date: 20121031
Implantable Lead Implant Date: 20121031
Implantable Lead Location: 753859
Implantable Lead Location: 753860
Implantable Lead Model: 5092
Implantable Lead Model: 5592
Implantable Pulse Generator Implant Date: 20230824
Lead Channel Impedance Value: 361 Ohm
Lead Channel Impedance Value: 380 Ohm
Lead Channel Impedance Value: 418 Ohm
Lead Channel Impedance Value: 456 Ohm
Lead Channel Pacing Threshold Amplitude: 0.625 V
Lead Channel Pacing Threshold Pulse Width: 0.4 ms
Lead Channel Sensing Intrinsic Amplitude: 2.75 mV
Lead Channel Sensing Intrinsic Amplitude: 2.75 mV
Lead Channel Sensing Intrinsic Amplitude: 6.625 mV
Lead Channel Sensing Intrinsic Amplitude: 6.625 mV
Lead Channel Setting Pacing Amplitude: 1.5 V
Lead Channel Setting Pacing Amplitude: 2.5 V
Lead Channel Setting Pacing Pulse Width: 0.4 ms
Lead Channel Setting Sensing Sensitivity: 0.9 mV
Zone Setting Status: 755011
Zone Setting Status: 755011

## 2024-02-10 NOTE — Progress Notes (Unsigned)
  Electrophysiology Office Follow up Visit Note:    Date:  02/11/2024   ID:  Jessica Browning, DOB 09-04-1934, MRN [id number]  PCP:  Rockney Cid, DO  [facility] Cardiologist:  None  [facility] Electrophysiologist:  Boyce Byes, MD    Interval History:     Jessica Browning is a 89 y.o. female who presents for a follow up visit.   She was last seen in the cardiology clinic September 18, 2022.  She has a dual-chamber permanent pacemaker in place for sick sinus syndrome.  She had a generator replacement in August 2023.  Recent interrogations have shown stable device function.        Past medical, surgical, social and family history were reviewed.  ROS:   Please see the history of present illness.    All other systems reviewed and are negative.  EKGs/Labs/Other Studies Reviewed:    The following studies were reviewed today:  February 11, 2024 in-clinic device interrogation personally reviewed Battery and lead parameters stable.          Physical Exam:    VS:  BP (!) 140/78   Pulse 86   Ht 5\' 3"  (1.6 m)   Wt 146 lb 9.6 oz (66.5 kg)   SpO2 98%   BMI 25.97 kg/m     Wt Readings from Last 3 Encounters:  02/11/24 146 lb 9.6 oz (66.5 kg)  12/16/23 144 lb 3.2 oz (65.4 kg)  10/23/23 146 lb 4.8 oz (66.4 kg)     GEN: no distress CARD: RRR, No MRG.  CIED pocket well healed RESP: No IWOB. CTAB.      ASSESSMENT:    1. Sick sinus syndrome (HCC)   2. Cardiac pacemaker in situ    PLAN:    In order of problems listed above:  #Sick sinus syndrome #Dual-chamber permanent pacemaker in situ Device functioning appropriately.  Continue remote monitoring.  Follow-up 1 year with EP APP   Signed, Harvie Liner, MD, [facility] 02/11/2024 10:32 AM    Electrophysiology [facility]

## 2024-02-11 ENCOUNTER — Ambulatory Visit: Attending: Cardiology | Admitting: Cardiology

## 2024-02-11 VITALS — BP 140/78 | HR 86 | Ht 63.0 in | Wt 146.6 lb

## 2024-02-11 DIAGNOSIS — Z950 Presence of cardiac pacemaker: Secondary | ICD-10-CM | POA: Insufficient documentation

## 2024-02-11 DIAGNOSIS — I495 Sick sinus syndrome: Secondary | ICD-10-CM | POA: Diagnosis not present

## 2024-02-11 NOTE — Patient Instructions (Signed)

## 2024-02-24 ENCOUNTER — Other Ambulatory Visit: Payer: Self-pay | Admitting: Internal Medicine

## 2024-02-25 NOTE — Telephone Encounter (Signed)
 Requested medication (s) are due for refill today: yes  Requested medication (s) are on the active medication list: yes  Last refill:  02/27/23 #90 3 RF  Future visit scheduled: yes  Notes to clinic:  overdue lab work    Requested Prescriptions  Pending Prescriptions Disp Refills   atorvastatin  (LIPITOR) 10 MG tablet [Pharmacy Med Name: ATORVASTATIN  10 MG TABLET] 90 tablet 3    Sig: TAKE 1 TABLET BY MOUTH EVERY DAY     Cardiovascular:  Antilipid - Statins Failed - 02/25/2024  2:16 PM      Failed - Lipid Panel in normal range within the last 12 months    Cholesterol  Date Value Ref Range Status  04/30/2022 146 <200 mg/dL Final   LDL Cholesterol (Calc)  Date Value Ref Range Status  04/30/2022 69 mg/dL (calc) Final    Comment:    Reference range: <100 . Desirable range <100 mg/dL for primary prevention;   <70 mg/dL for patients with CHD or diabetic patients  with > or = 2 CHD risk factors. Aaron Aas LDL-C is now calculated using the Martin-Hopkins  calculation, which is a validated novel method providing  better accuracy than the Friedewald equation in the  estimation of LDL-C.  Melinda Sprawls et al. Erroll Heard. 4132;440(10): 2061-2068  (http://education.[facility].com/faq/FAQ164)    HDL  Date Value Ref Range Status  04/30/2022 62 > OR = 50 mg/dL Final   Triglycerides  Date Value Ref Range Status  04/30/2022 72 <150 mg/dL Final         Passed - Patient is not pregnant      Passed - Valid encounter within last 12 months    Recent Outpatient Visits           2 months ago Essential hypertension   [facility] Rockney Cid, DO       Future Appointments             In 3 months Rockney Cid, DO [facility], [facility]

## 2024-03-12 NOTE — Progress Notes (Signed)
 Remote pacemaker transmission.

## 2024-04-29 ENCOUNTER — Ambulatory Visit (INDEPENDENT_AMBULATORY_CARE_PROVIDER_SITE_OTHER): Payer: 59

## 2024-04-29 DIAGNOSIS — I495 Sick sinus syndrome: Secondary | ICD-10-CM | POA: Diagnosis not present

## 2024-04-30 LAB — CUP PACEART REMOTE DEVICE CHECK
Battery Remaining Longevity: 143 mo
Battery Voltage: 3.04 V
Brady Statistic AP VP Percent: 0 %
Brady Statistic AP VS Percent: 94.1 %
Brady Statistic AS VP Percent: 0 %
Brady Statistic AS VS Percent: 5.9 %
Brady Statistic RA Percent Paced: 94.08 %
Brady Statistic RV Percent Paced: 0 %
Date Time Interrogation Session: 20250709213015
Implantable Lead Connection Status: 753985
Implantable Lead Connection Status: 753985
Implantable Lead Implant Date: 20121031
Implantable Lead Implant Date: 20121031
Implantable Lead Location: 753859
Implantable Lead Location: 753860
Implantable Lead Model: 5092
Implantable Lead Model: 5592
Implantable Pulse Generator Implant Date: 20230824
Lead Channel Impedance Value: 380 Ohm
Lead Channel Impedance Value: 418 Ohm
Lead Channel Impedance Value: 437 Ohm
Lead Channel Impedance Value: 513 Ohm
Lead Channel Pacing Threshold Amplitude: 0.625 V
Lead Channel Pacing Threshold Pulse Width: 0.4 ms
Lead Channel Sensing Intrinsic Amplitude: 3.875 mV
Lead Channel Sensing Intrinsic Amplitude: 3.875 mV
Lead Channel Sensing Intrinsic Amplitude: 7.125 mV
Lead Channel Sensing Intrinsic Amplitude: 7.125 mV
Lead Channel Setting Pacing Amplitude: 1.5 V
Lead Channel Setting Pacing Amplitude: 2.5 V
Lead Channel Setting Pacing Pulse Width: 0.4 ms
Lead Channel Setting Sensing Sensitivity: 0.9 mV
Zone Setting Status: 755011
Zone Setting Status: 755011

## 2024-05-01 ENCOUNTER — Ambulatory Visit: Payer: Self-pay | Admitting: Cardiology

## 2024-05-21 ENCOUNTER — Other Ambulatory Visit: Payer: Self-pay | Admitting: Internal Medicine

## 2024-05-21 DIAGNOSIS — I10 Essential (primary) hypertension: Secondary | ICD-10-CM

## 2024-05-21 NOTE — Telephone Encounter (Signed)
 Requested Prescriptions  Pending Prescriptions Disp Refills   amLODipine  (NORVASC ) 5 MG tablet [Pharmacy Med Name: AMLODIPINE  BESYLATE 5 MG TAB] 90 tablet 0    Sig: TAKE 1 TABLET (5 MG TOTAL) BY MOUTH DAILY.     Cardiovascular: Calcium  Channel Blockers 2 Failed - 05/21/2024  2:28 PM      Failed - Last BP in normal range    BP Readings from Last 1 Encounters:  02/11/24 (!) 140/78         Passed - Last Heart Rate in normal range    Pulse Readings from Last 1 Encounters:  02/11/24 86         Passed - Valid encounter within last 6 months    Recent Outpatient Visits           5 months ago Essential hypertension   [facility] Bernardo Fend, DO       Future Appointments             In 3 weeks Bernardo Fend, DO [facility], [facility]

## 2024-05-22 ENCOUNTER — Other Ambulatory Visit: Payer: Self-pay | Admitting: Internal Medicine

## 2024-05-24 NOTE — Telephone Encounter (Signed)
 Requested medications are due for refill today.  yes  Requested medications are on the active medications list.  yes  Last refill. 02/25/2024 #90 0 rf  Future visit scheduled.   yes  Notes to clinic.  Labs are expired.    Requested Prescriptions  Pending Prescriptions Disp Refills   atorvastatin  (LIPITOR) 10 MG tablet [Pharmacy Med Name: ATORVASTATIN  10 MG TABLET] 90 tablet 0    Sig: TAKE 1 TABLET BY MOUTH EVERY DAY     Cardiovascular:  Antilipid - Statins Failed - 05/24/2024  3:11 PM      Failed - Lipid Panel in normal range within the last 12 months    Cholesterol  Date Value Ref Range Status  04/30/2022 146 <200 mg/dL Final   LDL Cholesterol (Calc)  Date Value Ref Range Status  04/30/2022 69 mg/dL (calc) Final    Comment:    Reference range: <100 . Desirable range <100 mg/dL for primary prevention;   <70 mg/dL for patients with CHD or diabetic patients  with > or = 2 CHD risk factors. SABRA LDL-C is now calculated using the Martin-Hopkins  calculation, which is a validated novel method providing  better accuracy than the Friedewald equation in the  estimation of LDL-C.  Gladis APPLETHWAITE et al. SANDREA. 7986;689(80): 2061-2068  (http://education.[facility].com/faq/FAQ164)    HDL  Date Value Ref Range Status  04/30/2022 62 > OR = 50 mg/dL Final   Triglycerides  Date Value Ref Range Status  04/30/2022 72 <150 mg/dL Final         Passed - Patient is not pregnant      Passed - Valid encounter within last 12 months    Recent Outpatient Visits           5 months ago Essential hypertension   [facility] Bernardo Fend, DO       Future Appointments             In 3 weeks Bernardo Fend, DO [facility], [facility]

## 2024-06-15 ENCOUNTER — Encounter: Payer: Self-pay | Admitting: Internal Medicine

## 2024-06-15 ENCOUNTER — Ambulatory Visit (INDEPENDENT_AMBULATORY_CARE_PROVIDER_SITE_OTHER): Payer: 59 | Admitting: Internal Medicine

## 2024-06-15 ENCOUNTER — Other Ambulatory Visit: Payer: Self-pay

## 2024-06-15 VITALS — BP 138/84 | HR 92 | Temp 97.9°F | Resp 16 | Ht 62.0 in | Wt 143.8 lb

## 2024-06-15 DIAGNOSIS — E782 Mixed hyperlipidemia: Secondary | ICD-10-CM

## 2024-06-15 DIAGNOSIS — K219 Gastro-esophageal reflux disease without esophagitis: Secondary | ICD-10-CM

## 2024-06-15 DIAGNOSIS — Z95811 Presence of heart assist device: Secondary | ICD-10-CM | POA: Diagnosis not present

## 2024-06-15 DIAGNOSIS — I10 Essential (primary) hypertension: Secondary | ICD-10-CM | POA: Diagnosis not present

## 2024-06-15 DIAGNOSIS — N390 Urinary tract infection, site not specified: Secondary | ICD-10-CM

## 2024-06-15 DIAGNOSIS — F419 Anxiety disorder, unspecified: Secondary | ICD-10-CM

## 2024-06-15 LAB — CBC WITH DIFFERENTIAL/PLATELET
Absolute Lymphocytes: 1574 {cells}/uL (ref 850–3900)
Absolute Monocytes: 677 {cells}/uL (ref 200–950)
Basophils Absolute: 29 {cells}/uL (ref 0–200)
Basophils Relative: 0.6 %
Eosinophils Absolute: 58 {cells}/uL (ref 15–500)
Eosinophils Relative: 1.2 %
HCT: 36.7 % (ref 35.0–45.0)
Hemoglobin: 11.9 g/dL (ref 11.7–15.5)
MCH: 29.4 pg (ref 27.0–33.0)
MCHC: 32.4 g/dL (ref 32.0–36.0)
MCV: 90.6 fL (ref 80.0–100.0)
MPV: 10.2 fL (ref 7.5–12.5)
Monocytes Relative: 14.1 %
Neutro Abs: 2462 {cells}/uL (ref 1500–7800)
Neutrophils Relative %: 51.3 %
Platelets: 255 Thousand/uL (ref 140–400)
RBC: 4.05 Million/uL (ref 3.80–5.10)
RDW: 13 % (ref 11.0–15.0)
Total Lymphocyte: 32.8 %
WBC: 4.8 Thousand/uL (ref 3.8–10.8)

## 2024-06-15 LAB — COMPREHENSIVE METABOLIC PANEL WITH GFR
AG Ratio: 1.5 (calc) (ref 1.0–2.5)
ALT: 14 U/L (ref 6–29)
AST: 17 U/L (ref 10–35)
Albumin: 4.2 g/dL (ref 3.6–5.1)
Alkaline phosphatase (APISO): 90 U/L (ref 37–153)
BUN: 13 mg/dL (ref 7–25)
CO2: 28 mmol/L (ref 20–32)
Calcium: 10 mg/dL (ref 8.6–10.4)
Chloride: 99 mmol/L (ref 98–110)
Creat: 0.89 mg/dL (ref 0.60–0.95)
Globulin: 2.8 g/dL (ref 1.9–3.7)
Glucose, Bld: 96 mg/dL (ref 65–99)
Potassium: 4.7 mmol/L (ref 3.5–5.3)
Sodium: 135 mmol/L (ref 135–146)
Total Bilirubin: 0.6 mg/dL (ref 0.2–1.2)
Total Protein: 7 g/dL (ref 6.1–8.1)
eGFR: 63 mL/min/1.73m2 (ref >=60)

## 2024-06-15 LAB — LIPID PANEL
Cholesterol: 157 mg/dL (ref <200)
HDL: 63 mg/dL (ref >=50)
LDL Cholesterol (Calc): 76 mg/dL
Non-HDL Cholesterol (Calc): 94 mg/dL (ref <130)
Total CHOL/HDL Ratio: 2.5 (calc) (ref <5.0)
Triglycerides: 97 mg/dL (ref <150)

## 2024-06-15 MED ORDER — ATORVASTATIN CALCIUM 10 MG PO TABS
10.0000 mg | ORAL_TABLET | Freq: Every day | ORAL | 1 refills | Status: AC
Start: 2024-06-15 — End: ?

## 2024-06-15 MED ORDER — NITROFURANTOIN MACROCRYSTAL 100 MG PO CAPS
100.0000 mg | ORAL_CAPSULE | Freq: Every evening | ORAL | 2 refills | Status: AC
Start: 2024-06-15 — End: ?

## 2024-06-15 MED ORDER — BUSPIRONE HCL 10 MG PO TABS
10.0000 mg | ORAL_TABLET | Freq: Two times a day (BID) | ORAL | 1 refills | Status: AC
Start: 2024-06-15 — End: ?

## 2024-06-15 MED ORDER — AMLODIPINE BESYLATE 5 MG PO TABS
5.0000 mg | ORAL_TABLET | Freq: Every day | ORAL | 1 refills | Status: AC
Start: 2024-06-15 — End: ?

## 2024-06-15 MED ORDER — FAMOTIDINE 20 MG PO TABS
20.0000 mg | ORAL_TABLET | Freq: Every day | ORAL | 1 refills | Status: AC
Start: 2024-06-15 — End: ?

## 2024-06-15 NOTE — Progress Notes (Signed)
 Established Patient Office Visit  Subjective    Patient ID: PRABHJOT Jessica Browning, female    DOB: 1934/05/14  Age: 89 y.o. MRN: [id number]  CC:  Chief Complaint  Patient presents with   Medical Management of Chronic Issues    6 month recheck    HPI MORRISSA Browning presents to follow up. She is here with her daughter.   Discussed the use of AI scribe software for clinical note transcription with the patient, who gave verbal consent to proceed.  History of Present Illness Jessica Browning is an 89 year old female who presents with shortness of breath and a recent fall.  She experiences shortness of breath primarily during physical exertion. Her ankles swell, but the swelling improves with walking. She remains active by walking around her house and maintaining her large yard, which requires significant physical effort.  She recently fell while stepping onto a curb, resulting in a knee injury. The knee was initially black and swollen, but the hematoma has since improved, though it remains slightly sore.  Her current medications include amlodipine , a cholesterol medication, an anxiety medication taken as needed, and a stomach medication taken at night. She experienced increased anxiety a month or two ago, during which she took the anxiety medication more frequently.  She has a pacemaker monitored by a device in her bedroom, with follow-ups scheduled annually. Her last cardiology visit was in April with Dr. Cindie, who confirmed the device is functioning properly.   Hypertension/Sinus Sick Syndrome: -Medications: Amlodipine  5 mg -Patient is compliant with above medications and reports no side effects. -Checking BP at home (average): doesn't check -Denies any SOB, CP, vision changes or symptoms of hypotension.  Has noticed gravity dependent edema, patient states it is worse in her right lower extremity.  This only occurs after she is doing work in the yard and will resolve with  elevating her legs. -Following with Cardiology, EP last seen 02/11/24 -Pacemaker placed in August 2023  HLD: -Medications: Lipitor 10 mg, aspirin 81 mg -Patient is compliant with above medications and reports no side effects.  -Last lipid panel: Lipid Panel     Component Value Date/Time   CHOL 146 04/30/2022 1639   TRIG 72 04/30/2022 1639   HDL 62 04/30/2022 1639   CHOLHDL 2.4 04/30/2022 1639   LDLCALC 69 04/30/2022 1639   Anxiety: -Currently on Buspar  10 mg once daily, taking at night although we did discuss increasing dose to TID PRN -Had been on Xanax  0.25 mg before bed every day for the last 20+ years but she tapered off this successfully      10/23/2023    2:25 PM 09/11/2023    1:01 PM 05/27/2023    1:05 PM 05/20/2023   10:41 AM 03/04/2023    1:56 PM  Depression screen PHQ 2/9  Decreased Interest 1 0 0 0 0  Down, Depressed, Hopeless 1 0 0 0 0  PHQ - 2 Score 2 0 0 0 0  Altered sleeping 0 0 0 0 0  Tired, decreased energy 1 0 0 0 0  Change in appetite 0 0 0 0 0  Feeling bad or failure about yourself  0 0 0 0 0  Trouble concentrating 0 0 0 0 0  Moving slowly or fidgety/restless 0 0 0 0 0  Suicidal thoughts 0 0 0 0 0  PHQ-9 Score 3 0 0 0 0  Difficult doing work/chores Not difficult at all Not difficult at all Not difficult at all  Not difficult at all Not difficult at all   Also has a history of recurrent UTI's, has been on Nitrofurantoin  100 mg at bedtime for years for suppression therapy. Notes reviewed from Urogynecology  reviewed from 05/01/21. No symptoms since being on the suppression therapy.   GERD: -Now on Pepcid  20 mg daily, symptoms well controlled   Health maintenance: -Blood work due -Discussed RSV vaccine at the pharmacy    Outpatient Encounter Medications as of 06/15/2024  Medication Sig   amLODipine  (NORVASC ) 5 MG tablet TAKE 1 TABLET (5 MG TOTAL) BY MOUTH DAILY.   aspirin EC 81 MG tablet Take 81 mg by mouth daily. Swallow whole.   atorvastatin  (LIPITOR)  10 MG tablet TAKE 1 TABLET BY MOUTH EVERY DAY   busPIRone  (BUSPAR ) 10 MG tablet TAKE 1 TABLET BY MOUTH TWICE A DAY   Calcium  Carb-Cholecalciferol  600-10 MG-MCG TABS Take 2 tablets by mouth daily.   docusate sodium (COLACE) 100 MG capsule Take 100 mg by mouth 2 (two) times daily.   EPINEPHrine  0.3 mg/0.3 mL IJ SOAJ injection Inject 0.3 mg into the muscle as needed for anaphylaxis.   famotidine  (PEPCID ) 20 MG tablet TAKE 1 TABLET BY MOUTH EVERY DAY   nitrofurantoin  (MACRODANTIN ) 100 MG capsule Take 1 capsule (100 mg total) by mouth at bedtime.   No facility-administered encounter medications on file as of 06/15/2024.    Past Medical History:  Diagnosis Date   Anxiety    Basal cell carcinoma 06/18/2016   Posterior neck. Nodular pattern.   Hyperlipidemia    Hypertension     Past Surgical History:  Procedure Laterality Date   ABDOMINAL HYSTERECTOMY     APPENDECTOMY     BIV PACEMAKER GENERATOR CHANGEOUT N/A 06/13/2022   Procedure: BIV PACEMAKER GENERATOR CHANGEOUT;  Surgeon: Cindie Ole DASEN, MD;  Location: [facility];  Service: Cardiovascular;  Laterality: N/A;   BLADDER SURGERY     CATARACT EXTRACTION     VEIN LIGATION AND STRIPPING     bilateral legs    Family History  Problem Relation Age of Onset   Pancreatic disease Mother    COPD Sister    Rheum arthritis Sister    Hypertension Daughter    Hyperlipidemia Daughter    Hyperlipidemia Son    Hypertension Son    Heart attack Son     Social History   Socioeconomic History   Marital status: Widowed    Spouse name: Not on file   Number of children: 6   Years of education: Not on file   Highest education level: Not on file  Occupational History   Occupation: Retired  Tobacco Use   Smoking status: Never   Smokeless tobacco: Never  Vaping Use   Vaping status: Never Used  Substance and Sexual Activity   Alcohol use: Never   Drug use: Never   Sexual activity: Not Currently  Other Topics Concern   Not on  file  Social History Narrative   Not on file   Social Drivers of Health   Financial Resource Strain: Low Risk  (10/23/2023)   Overall Financial Resource Strain (CARDIA)    Difficulty of Paying Living Expenses: Not hard at all  Food Insecurity: No Food Insecurity (10/23/2023)   Hunger Vital Sign    Worried About Running Out of Food in the Last Year: Never true    Ran Out of Food in the Last Year: Never true  Transportation Needs: No Transportation Needs (10/23/2023)   PRAPARE - Transportation  Lack of Transportation (Medical): No    Lack of Transportation (Non-Medical): No  Physical Activity: Insufficiently Active (10/23/2023)   Exercise Vital Sign    Days of Exercise per Week: 2 days    Minutes of Exercise per Session: 20 min  Stress: No Stress Concern Present (10/23/2023)   [employer] of Occupational Health - Occupational Stress Questionnaire    Feeling of Stress : Not at all  Social Connections: Moderately Integrated (10/23/2023)   Social Connection and Isolation Panel    Frequency of Communication with Friends and Family: More than three times a week    Frequency of Social Gatherings with Friends and Family: Once a week    Attends Religious Services: More than 4 times per year    Active Member of [employer] or Organizations: Yes    Attends Banker Meetings: More than 4 times per year    Marital Status: Widowed  Intimate Partner Violence: Not At Risk (10/23/2023)   Humiliation, Afraid, Rape, and Kick questionnaire    Fear of Current or Ex-Partner: No    Emotionally Abused: No    Physically Abused: No    Sexually Abused: No    Review of Systems  Constitutional:  Negative for chills and fever.  Eyes:  Negative for blurred vision.  Respiratory:  Positive for shortness of breath.   Cardiovascular:  Negative for chest pain and leg swelling.  Gastrointestinal:  Negative for abdominal pain, heartburn, nausea and vomiting.  Musculoskeletal:  Positive for falls.   Neurological:  Negative for dizziness, weakness and headaches.  Psychiatric/Behavioral:  The patient does not have insomnia.        Objective    BP 138/84 (Cuff Size: Large)   Pulse 92   Temp 97.9 F (36.6 C) (Oral)   Resp 16   Ht 5' 2 (1.575 m)   Wt 143 lb 12.8 oz (65.2 kg)   SpO2 99%   BMI 26.30 kg/m   Physical Exam Constitutional:      Appearance: Normal appearance.  HENT:     Head: Normocephalic and atraumatic.  Eyes:     Conjunctiva/sclera: Conjunctivae normal.  Cardiovascular:     Rate and Rhythm: Normal rate and regular rhythm.  Pulmonary:     Effort: Pulmonary effort is normal.     Breath sounds: Normal breath sounds.  Musculoskeletal:     Right lower leg: No edema.     Left lower leg: No edema.     Comments: Mild bilateral lower extremity nonpitting edema  Skin:    General: Skin is warm and dry.  Neurological:     General: No focal deficit present.     Mental Status: She is alert. Mental status is at baseline.  Psychiatric:        Mood and Affect: Mood normal.        Behavior: Behavior normal.       Assessment & Plan:   Assessment & Plan Shortness of breath and lower extremity edema, likely due to deconditioning Shortness of breath during ambulation likely due to deconditioning. Lower extremity edema improves with activity. - Encourage continued walking and yard work as tolerated. - Consider using a stationary pedal exerciser to improve leg strength and circulation.  Hypertension Blood pressure well-controlled at 138/82 mmHg. - Continue current dose of amlodipine .  Hyperlipidemia Cholesterol levels previously checked and were within normal limits. - Continue current management.  Anxiety disorder Anxiety managed with as-needed medication. - Continue current anxiety medication with as-needed dosing up to three  times daily.  Cardiac pacemaker, routine monitoring Pacemaker functioning properly as per recent cardiology evaluation. - Continue  routine pacemaker monitoring with home device.  - CBC w/Diff/Platelet - Comprehensive Metabolic Panel (CMET) - amLODipine  (NORVASC ) 5 MG tablet; Take 1 tablet (5 mg total) by mouth daily.  Dispense: 90 tablet; Refill: 1 - busPIRone  (BUSPAR ) 10 MG tablet; Take 1 tablet (10 mg total) by mouth 2 (two) times daily.  Dispense: 180 tablet; Refill: 1 - nitrofurantoin  (MACRODANTIN ) 100 MG capsule; Take 1 capsule (100 mg total) by mouth at bedtime.  Dispense: 60 capsule; Refill: 2 - famotidine  (PEPCID ) 20 MG tablet; Take 1 tablet (20 mg total) by mouth daily.  Dispense: 90 tablet; Refill: 1 - Lipid Profile - atorvastatin  (LIPITOR) 10 MG tablet; Take 1 tablet (10 mg total) by mouth daily.  Dispense: 90 tablet; Refill: 1   Return in about 6 months (around 12/16/2024).   Sharyle Fischer, DO

## 2024-06-15 NOTE — Patient Instructions (Signed)
 Respiratory Syncytial Virus (RSV) Vaccine Injection What is this medication? RESPIRATORY SYNCYTIAL VIRUS VACCINE (reh SPIR uh tor ee sin SISH uhl VY rus vak SEEN) reduces the risk of respiratory syncytial virus (RSV). It does not treat RSV. It is still possible to get RSV after receiving this vaccine, but the symptoms may be less severe or not last as long. It works by helping your immune system learn how to fight off a future infection. This medicine may be used for other purposes; ask your health care provider or pharmacist if you have questions. COMMON BRAND NAME(S): ABRYSVO, AREXVY, mRESVIA What should I tell my care team before I take this medication? They need to know if you have any of these conditions: Immune system problems An unusual or allergic reaction to respiratory syncytial virus vaccine, other medications, foods, dyes, or preservatives Pregnant or trying to get pregnant Breastfeeding How should I use this medication? This vaccine is injected into a muscle. It is given by your care team. A copy of Vaccine Information Statements will be given before each vaccination. Be sure to read this information carefully each time. This sheet may change often. A copy of Vaccine Information Statements will be given before each vaccination. Be sure to read this information carefully each time. This sheet may change often. Talk to your care team about the use of this vaccine in children. It is not approved for use in children. Overdosage: If you think you have taken too much of this medicine contact a poison control center or emergency room at once. NOTE: This medicine is only for you. Do not share this medicine with others. What if I miss a dose? This does not apply. What may interact with this medication? Medications that lower your chance of fighting infection This list may not describe all possible interactions. Give your health care provider a list of all the medicines, herbs,  non-prescription drugs, or dietary supplements you use. Also tell them if you smoke, drink alcohol, or use illegal drugs. Some items may interact with your medicine. What should I watch for while using this medication? Visit your care team for regular health checks. Before you receive this vaccine, talk to your care team if you have an acute illness. Vaccines can be given to people with mild acute illness, such as the common cold or diarrhea. Discuss with your care team the risks and benefits of receiving this vaccine during a moderate to severe illness. Your care team may choose to wait to give you the vaccine when you feel better. Report any side effects to your care team or to the Vaccine Adverse Event Reporting System (VAERS) website at https://vaers.LAgents.no. This is only for reporting side effects; VAERs staff do not give medical advice. What side effects may I notice from receiving this medication? Side effects that you should report to your care team as soon as possible: Allergic reactions--skin rash, itching, hives, swelling of the face, lips, tongue, or throat Feeling faint or lightheaded Weakness and tingling in the feet and legs that spreads to upper body Side effects that usually do not require medical attention (report these to your care team if they continue or are bothersome): General discomfort and fatigue Headache Muscle pain Pain, redness, or irritation at injection site This list may not describe all possible side effects. Call your doctor for medical advice about side effects. You may report side effects to FDA at 1-800-FDA-1088. Where should I keep my medication? This vaccine is only given by your care  team. It will not be stored at home. NOTE: This sheet is a summary. It may not cover all possible information. If you have questions about this medicine, talk to your doctor, pharmacist, or health care provider.  2025 Elsevier/Gold Standard (2023-10-29 00:00:00)

## 2024-06-16 ENCOUNTER — Ambulatory Visit: Payer: Self-pay | Admitting: Internal Medicine

## 2024-07-17 DIAGNOSIS — Z23 Encounter for immunization: Secondary | ICD-10-CM | POA: Diagnosis not present

## 2024-07-30 ENCOUNTER — Ambulatory Visit (INDEPENDENT_AMBULATORY_CARE_PROVIDER_SITE_OTHER): Payer: 59

## 2024-07-30 DIAGNOSIS — I495 Sick sinus syndrome: Secondary | ICD-10-CM

## 2024-07-30 LAB — CUP PACEART REMOTE DEVICE CHECK
Battery Remaining Longevity: 139 mo
Battery Voltage: 3.03 V
Brady Statistic AP VP Percent: 0 %
Brady Statistic AP VS Percent: 93.99 %
Brady Statistic AS VP Percent: 0 %
Brady Statistic AS VS Percent: 6.01 %
Brady Statistic RA Percent Paced: 93.97 %
Brady Statistic RV Percent Paced: 0 %
Date Time Interrogation Session: 20251010044623
Implantable Lead Connection Status: 753985
Implantable Lead Connection Status: 753985
Implantable Lead Implant Date: 20121031
Implantable Lead Implant Date: 20121031
Implantable Lead Location: 753859
Implantable Lead Location: 753860
Implantable Lead Model: 5092
Implantable Lead Model: 5592
Implantable Pulse Generator Implant Date: 20230824
Lead Channel Impedance Value: 323 Ohm
Lead Channel Impedance Value: 380 Ohm
Lead Channel Impedance Value: 399 Ohm
Lead Channel Impedance Value: 475 Ohm
Lead Channel Pacing Threshold Amplitude: 0.625 V
Lead Channel Pacing Threshold Pulse Width: 0.4 ms
Lead Channel Sensing Intrinsic Amplitude: 2.375 mV
Lead Channel Sensing Intrinsic Amplitude: 2.375 mV
Lead Channel Sensing Intrinsic Amplitude: 5.75 mV
Lead Channel Sensing Intrinsic Amplitude: 5.75 mV
Lead Channel Setting Pacing Amplitude: 1.5 V
Lead Channel Setting Pacing Amplitude: 2.5 V
Lead Channel Setting Pacing Pulse Width: 0.4 ms
Lead Channel Setting Sensing Sensitivity: 0.9 mV
Zone Setting Status: 755011
Zone Setting Status: 755011

## 2024-07-30 NOTE — Progress Notes (Signed)
 Remote PPM Transmission

## 2024-08-02 ENCOUNTER — Ambulatory Visit: Payer: Self-pay | Admitting: Cardiology

## 2024-08-02 NOTE — Progress Notes (Signed)
 Remote PPM Transmission

## 2024-10-29 ENCOUNTER — Ambulatory Visit: Payer: 59

## 2024-10-29 DIAGNOSIS — I495 Sick sinus syndrome: Secondary | ICD-10-CM | POA: Diagnosis not present

## 2024-10-31 ENCOUNTER — Ambulatory Visit: Payer: Self-pay | Admitting: Cardiology

## 2024-10-31 LAB — CUP PACEART REMOTE DEVICE CHECK
Battery Remaining Longevity: 136 mo
Battery Voltage: 3.03 V
Brady Statistic AP VP Percent: 0 %
Brady Statistic AP VS Percent: 94.62 %
Brady Statistic AS VP Percent: 0 %
Brady Statistic AS VS Percent: 5.37 %
Brady Statistic RA Percent Paced: 94.63 %
Brady Statistic RV Percent Paced: 0 %
Date Time Interrogation Session: 20260108210745
Implantable Lead Connection Status: 753985
Implantable Lead Connection Status: 753985
Implantable Lead Implant Date: 20121031
Implantable Lead Implant Date: 20121031
Implantable Lead Location: 753859
Implantable Lead Location: 753860
Implantable Lead Model: 5092
Implantable Lead Model: 5592
Implantable Pulse Generator Implant Date: 20230824
Lead Channel Impedance Value: 361 Ohm
Lead Channel Impedance Value: 380 Ohm
Lead Channel Impedance Value: 456 Ohm
Lead Channel Impedance Value: 475 Ohm
Lead Channel Pacing Threshold Amplitude: 0.5 V
Lead Channel Pacing Threshold Pulse Width: 0.4 ms
Lead Channel Sensing Intrinsic Amplitude: 2.875 mV
Lead Channel Sensing Intrinsic Amplitude: 2.875 mV
Lead Channel Sensing Intrinsic Amplitude: 6.875 mV
Lead Channel Sensing Intrinsic Amplitude: 6.875 mV
Lead Channel Setting Pacing Amplitude: 1.5 V
Lead Channel Setting Pacing Amplitude: 2.5 V
Lead Channel Setting Pacing Pulse Width: 0.4 ms
Lead Channel Setting Sensing Sensitivity: 0.9 mV
Zone Setting Status: 755011
Zone Setting Status: 755011

## 2024-11-01 NOTE — Progress Notes (Signed)
 Remote PPM Transmission

## 2024-11-04 ENCOUNTER — Ambulatory Visit (INDEPENDENT_AMBULATORY_CARE_PROVIDER_SITE_OTHER): Payer: Self-pay

## 2024-11-04 VITALS — BP 120/82 | Ht 60.0 in | Wt 146.6 lb

## 2024-11-04 DIAGNOSIS — Z0000 Encounter for general adult medical examination without abnormal findings: Secondary | ICD-10-CM

## 2024-11-04 NOTE — Patient Instructions (Addendum)
 [name],  Thank you for taking the time for your Medicare Wellness Visit. I appreciate your continued commitment to your health goals. Please review the care plan we discussed, and feel free to reach out if I can assist you further.  Please note that Annual Wellness Visits do not include a physical exam. Some assessments may be limited, especially if the visit was conducted virtually. If needed, we may recommend an in-person follow-up with your provider.  Ongoing Care Seeing your primary care provider every 3 to 6 months helps us  monitor your health and provide consistent, personalized care. 12/16/24 @ 9:00 AM APPT W/ DR.[email or web address]  Referrals If a referral was made during today's visit and you haven't received any updates within two weeks, please contact the referred provider directly to check on the status.  Recommended Screenings:  Health Maintenance  Topic Date Due   Osteoporosis screening with Bone Density Scan  Never done   Zoster (Shingles) Vaccine (2 of 2) 01/26/2013   COVID-19 Vaccine (5 - 2025-26 season) 06/21/2024   Medicare Annual Wellness Visit  10/22/2024   Pneumococcal Vaccine for age over 75  Completed   Flu Shot  Completed   Meningitis B Vaccine  Aged Out   DTaP/Tdap/Td vaccine  Discontinued      Vision: Annual vision screenings are recommended for early detection of glaucoma, cataracts, and diabetic retinopathy. These exams can also reveal signs of chronic conditions such as diabetes and high blood pressure.  Dental: Annual dental screenings help detect early signs of oral cancer, gum disease, and other conditions linked to overall health, including heart disease and diabetes.  Please see the attached documents for additional preventive care recommendations.   NEXT AWV 11/10/25 @ 1:40 PM IN PERSON

## 2024-11-04 NOTE — Progress Notes (Signed)
 "  Chief Complaint  Patient presents with   Medicare Wellness     Subjective:   Jessica Browning is a 89 y.o. female who presents for a Medicare Annual Wellness Visit.  Visit info / Clinical Intake: Medicare Wellness Visit Type:: Subsequent Annual Wellness Visit Persons participating in visit and providing information:: patient Medicare Wellness Visit Mode:: In-person (required for WTM) Interpreter Needed?: No Pre-visit prep was completed: yes AWV questionnaire completed by patient prior to visit?: no Living arrangements:: (!) lives alone Patient's Overall Health Status Rating: very good Typical amount of pain: none Does pain affect daily life?: no Are you currently prescribed opioids?: no  Dietary Habits and Nutritional Risks How many meals a day?: 3 Eats fruit and vegetables daily?: yes Most meals are obtained by: preparing own meals In the last 2 weeks, have you had any of the following?: none Diabetic:: no  Functional Status Activities of Daily Living (to include ambulation/medication): Independent Ambulation: Independent Medication Administration: Independent Home Management (perform basic housework or laundry): Independent Manage your own finances?: yes Primary transportation is: driving Concerns about vision?: no *vision screening is required for WTM* (READERS- PATTY VISION- YEARLY- IN [place]) Concerns about hearing?: (!) yes Uses hearing aids?: (!) yes Hear whispered voice?: (!) no *in-person visit only*  Fall Screening Falls in the past year?: 1 Number of falls in past year: 0 Was there an injury with Fall?: 0 Fall Risk Category Calculator: 1 Patient Fall Risk Level: Low Fall Risk  Fall Risk Patient at Risk for Falls Due to: History of fall(s) Fall risk Follow up: Falls evaluation completed; Falls prevention discussed  Home and Transportation Safety: All rugs have non-skid backing?: yes All stairs or steps have railings?: yes Grab bars in the  bathtub or shower?: yes Have non-skid surface in bathtub or shower?: yes Good home lighting?: yes Regular seat belt use?: yes Hospital stays in the last year:: no  Cognitive Assessment Difficulty concentrating, remembering, or making decisions? : yes Will 6CIT or Mini Cog be Completed: yes What year is it?: 0 points What month is it?: 0 points Give patient an address phrase to remember (5 components): [address] [place], [place] About what time is it?: 0 points Count backwards from 20 to 1: 4 points Say the months of the year in reverse: 0 points Repeat the address phrase from earlier: 4 points 6 CIT Score: 8 points  Advance Directives (For Healthcare) Does Patient Have a Medical Advance Directive?: No Would patient like information on creating a medical advance directive?: No - Patient declined  Reviewed/Updated  Reviewed/Updated: Reviewed All (Medical, Surgical, Family, Medications, Allergies, Care Teams, Patient Goals)    Allergies (verified) Doxycycline and Tetanus toxoid-containing vaccines   Current Medications (verified) Outpatient Encounter Medications as of 11/04/2024  Medication Sig   amLODipine  (NORVASC ) 5 MG tablet Take 1 tablet (5 mg total) by mouth daily.   aspirin EC 81 MG tablet Take 81 mg by mouth daily. Swallow whole.   atorvastatin  (LIPITOR) 10 MG tablet Take 1 tablet (10 mg total) by mouth daily.   busPIRone  (BUSPAR ) 10 MG tablet Take 1 tablet (10 mg total) by mouth 2 (two) times daily.   Calcium  Carb-Cholecalciferol  600-10 MG-MCG TABS Take 2 tablets by mouth daily.   docusate sodium (COLACE) 100 MG capsule Take 100 mg by mouth 2 (two) times daily.   EPINEPHrine  0.3 mg/0.3 mL IJ SOAJ injection Inject 0.3 mg into the muscle as needed for anaphylaxis.   famotidine  (PEPCID ) 20 MG tablet  Take 1 tablet (20 mg total) by mouth daily.   nitrofurantoin  (MACRODANTIN ) 100 MG capsule Take 1 capsule (100 mg total) by mouth at bedtime.   No facility-administered  encounter medications on file as of 11/04/2024.    History: Past Medical History:  Diagnosis Date   Anxiety    Basal cell carcinoma 06/18/2016   Posterior neck. Nodular pattern.   Hyperlipidemia    Hypertension    Past Surgical History:  Procedure Laterality Date   ABDOMINAL HYSTERECTOMY     APPENDECTOMY     BIV PACEMAKER GENERATOR CHANGEOUT N/A 06/13/2022   Procedure: BIV PACEMAKER GENERATOR CHANGEOUT;  Surgeon: Cindie Ole DASEN, MD;  Location: [facility];  Service: Cardiovascular;  Laterality: N/A;   BLADDER SURGERY     CATARACT EXTRACTION     VEIN LIGATION AND STRIPPING     bilateral legs   Family History  Problem Relation Age of Onset   Pancreatic disease Mother    COPD Sister    Rheum arthritis Sister    Hypertension Daughter    Hyperlipidemia Daughter    Hyperlipidemia Son    Hypertension Son    Heart attack Son    Social History   Occupational History   Occupation: Retired  Tobacco Use   Smoking status: Never   Smokeless tobacco: Never  Vaping Use   Vaping status: Never Used  Substance and Sexual Activity   Alcohol use: Never   Drug use: Never   Sexual activity: Not Currently   Tobacco Counseling Counseling given: Not Answered  SDOH Screenings   Food Insecurity: No Food Insecurity (11/04/2024)  Housing: Unknown (11/04/2024)  Transportation Needs: No Transportation Needs (11/04/2024)  Utilities: Not At Risk (11/04/2024)  Alcohol Screen: Low Risk (10/23/2023)  Depression (PHQ2-9): Low Risk (11/04/2024)  Financial Resource Strain: Low Risk (10/23/2023)  Physical Activity: Insufficiently Active (11/04/2024)  Social Connections: Moderately Integrated (11/04/2024)  Stress: No Stress Concern Present (11/04/2024)  Tobacco Use: Low Risk (11/04/2024)  Health Literacy: Adequate Health Literacy (11/04/2024)   See flowsheets for full screening details  Depression Screen PHQ 2 & 9 Depression Scale- Over the past 2 weeks, how often have you been bothered by any  of the following problems? Little interest or pleasure in doing things: 0 Feeling down, depressed, or hopeless (PHQ Adolescent also includes...irritable): 0 PHQ-2 Total Score: 0 Trouble falling or staying asleep, or sleeping too much: 0 Feeling tired or having little energy: 0 Poor appetite or overeating (PHQ Adolescent also includes...weight loss): 0 Feeling bad about yourself - or that you are a failure or have let yourself or your family down: 0 Trouble concentrating on things, such as reading the newspaper or watching television (PHQ Adolescent also includes...like school work): 0 Moving or speaking so slowly that other people could have noticed. Or the opposite - being so fidgety or restless that you have been moving around a lot more than usual: 0 Thoughts that you would be better off dead, or of hurting yourself in some way: 0 PHQ-9 Total Score: 0 If you checked off any problems, how difficult have these problems made it for you to do your work, take care of things at home, or get along with other people?: Not difficult at all  Depression Treatment Depression Interventions/Treatment : [id number] Score <4 Follow-up Not Indicated     Goals Addressed             This Visit's Progress    DIET - INCREASE WATER INTAKE  Objective:    Today's Vitals   11/04/24 1405  BP: 120/82  Weight: 146 lb 9.6 oz (66.5 kg)  Height: 5' (1.524 m)   Body mass index is 28.63 kg/m.  Hearing/Vision screen Hearing Screening - Comments:: WEARS AIDS, BOTH EARS Vision Screening - Comments:: READERES- PATTY VISION- YEARLY Immunizations and Health Maintenance Health Maintenance  Topic Date Due   Bone Density Scan  Never done   Zoster Vaccines- Shingrix (2 of 2) 01/26/2013   COVID-19 Vaccine (5 - 2025-26 season) 06/21/2024   Medicare Annual Wellness (AWV)  10/22/2024   Pneumococcal Vaccine: 50+ Years  Completed   Influenza Vaccine  Completed   Meningococcal B Vaccine  Aged Out    DTaP/Tdap/Td  Discontinued        Assessment/Plan:  This is a routine wellness examination for Sheril.  Patient Care Team: Bernardo Fend, DO as PCP - General (Internal Medicine) Cindie Ole DASEN, MD (Inactive) as PCP - Electrophysiology (Cardiology) [facility], [facility]  I have personally reviewed and noted the following in the patients chart:   Medical and social history Use of alcohol, tobacco or illicit drugs  Current medications and supplements including opioid prescriptions. Functional ability and status Nutritional status Physical activity Advanced directives List of other physicians Hospitalizations, surgeries, and ER visits in previous 12 months Vitals Screenings to include cognitive, depression, and falls Referrals and appointments  No orders of the defined types were placed in this encounter.  In addition, I have reviewed and discussed with patient certain preventive protocols, quality metrics, and best practice recommendations. A written personalized care plan for preventive services as well as general preventive health recommendations were provided to patient.   Jhonnie GORMAN Das, LPN   [id number]/84/7973   Return in about 53 weeks (around 11/10/2025).  After Visit Summary: (In Person-Printed) AVS printed and given to the patient  Nurse Notes: UTD ON SHOTS; AGED OUT OF MAMMOGRAM, COLONOSCOPY   "

## 2024-12-16 ENCOUNTER — Ambulatory Visit: Admitting: Internal Medicine

## 2025-02-14 ENCOUNTER — Ambulatory Visit: Admitting: Cardiology

## 2025-11-10 ENCOUNTER — Ambulatory Visit
